# Patient Record
Sex: Female | Born: 1960 | Race: White | Hispanic: No | Marital: Married | Smoking: Never smoker
Health system: Southern US, Community
[De-identification: ages and names within clinical notes are randomized; demographics above are authoritative.]

## PROBLEM LIST (undated history)

## (undated) DIAGNOSIS — E669 Obesity, unspecified: Secondary | ICD-10-CM

## (undated) DIAGNOSIS — R7303 Prediabetes: Secondary | ICD-10-CM

## (undated) DIAGNOSIS — F909 Attention-deficit hyperactivity disorder, unspecified type: Secondary | ICD-10-CM

## (undated) DIAGNOSIS — J45909 Unspecified asthma, uncomplicated: Secondary | ICD-10-CM

## (undated) DIAGNOSIS — F329 Major depressive disorder, single episode, unspecified: Secondary | ICD-10-CM

## (undated) DIAGNOSIS — F32A Depression, unspecified: Secondary | ICD-10-CM

## (undated) DIAGNOSIS — K828 Other specified diseases of gallbladder: Secondary | ICD-10-CM

## (undated) DIAGNOSIS — K219 Gastro-esophageal reflux disease without esophagitis: Secondary | ICD-10-CM

## (undated) DIAGNOSIS — I10 Essential (primary) hypertension: Secondary | ICD-10-CM

## (undated) DIAGNOSIS — E785 Hyperlipidemia, unspecified: Secondary | ICD-10-CM

## (undated) DIAGNOSIS — R7301 Impaired fasting glucose: Secondary | ICD-10-CM

## (undated) HISTORY — DX: Unspecified asthma, uncomplicated: J45.909

## (undated) HISTORY — DX: Essential (primary) hypertension: I10

## (undated) HISTORY — DX: Other specified diseases of gallbladder: K82.8

## (undated) HISTORY — PX: TONSILLECTOMY: SUR1361

## (undated) HISTORY — DX: Hyperlipidemia, unspecified: E78.5

## (undated) HISTORY — DX: Depression, unspecified: F32.A

## (undated) HISTORY — DX: Prediabetes: R73.03

## (undated) HISTORY — DX: Gastro-esophageal reflux disease without esophagitis: K21.9

## (undated) HISTORY — DX: Attention-deficit hyperactivity disorder, unspecified type: F90.9

## (undated) HISTORY — DX: Impaired fasting glucose: R73.01

## (undated) HISTORY — DX: Obesity, unspecified: E66.9

---
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
## 2004-06-05 HISTORY — PX: ABDOMINAL HYSTERECTOMY: SHX81

## 2004-06-05 HISTORY — PX: VAGINAL HYSTERECTOMY: SUR661

## 2007-06-06 HISTORY — PX: HERNIA REPAIR: SHX51

## 2012-05-05 LAB — HM PAP SMEAR

## 2015-02-19 LAB — HM COLONOSCOPY

## 2017-05-25 LAB — HM DIABETES EYE EXAM

## 2017-08-08 LAB — HM MAMMOGRAPHY: HM Mammogram: ABNORMAL — AB (ref 0–4)

## 2018-06-05 HISTORY — PX: CHOLECYSTECTOMY: SHX55

## 2019-07-14 ENCOUNTER — Other Ambulatory Visit: Payer: Self-pay

## 2019-07-14 ENCOUNTER — Other Ambulatory Visit: Payer: Self-pay | Admitting: Family Medicine

## 2019-07-14 DIAGNOSIS — K219 Gastro-esophageal reflux disease without esophagitis: Secondary | ICD-10-CM | POA: Insufficient documentation

## 2019-07-14 DIAGNOSIS — E669 Obesity, unspecified: Secondary | ICD-10-CM | POA: Insufficient documentation

## 2019-07-14 DIAGNOSIS — F330 Major depressive disorder, recurrent, mild: Secondary | ICD-10-CM | POA: Insufficient documentation

## 2019-07-14 DIAGNOSIS — F909 Attention-deficit hyperactivity disorder, unspecified type: Secondary | ICD-10-CM | POA: Insufficient documentation

## 2019-07-14 DIAGNOSIS — E785 Hyperlipidemia, unspecified: Secondary | ICD-10-CM | POA: Insufficient documentation

## 2019-07-14 DIAGNOSIS — J45901 Unspecified asthma with (acute) exacerbation: Secondary | ICD-10-CM | POA: Insufficient documentation

## 2019-07-14 DIAGNOSIS — K828 Other specified diseases of gallbladder: Secondary | ICD-10-CM | POA: Insufficient documentation

## 2019-07-14 DIAGNOSIS — F329 Major depressive disorder, single episode, unspecified: Secondary | ICD-10-CM | POA: Insufficient documentation

## 2019-07-14 DIAGNOSIS — J45909 Unspecified asthma, uncomplicated: Secondary | ICD-10-CM | POA: Insufficient documentation

## 2019-07-14 MED ORDER — AMPHETAMINE-DEXTROAMPHETAMINE 20 MG PO TABS: 20.0000 mg | ORAL_TABLET | Freq: Two times a day (BID) | ORAL | 0 refills | Status: DC

## 2019-07-18 ENCOUNTER — Encounter: Payer: Self-pay | Admitting: Family Medicine

## 2019-07-18 ENCOUNTER — Ambulatory Visit: Payer: BC Managed Care – PPO | Admitting: Family Medicine

## 2019-07-18 ENCOUNTER — Other Ambulatory Visit: Payer: Self-pay

## 2019-07-18 VITALS — BP 122/68 | HR 78 | Temp 97.8°F | Ht 68.0 in | Wt 178.4 lb

## 2019-07-18 DIAGNOSIS — H66002 Acute suppurative otitis media without spontaneous rupture of ear drum, left ear: Secondary | ICD-10-CM | POA: Insufficient documentation

## 2019-07-18 DIAGNOSIS — H60312 Diffuse otitis externa, left ear: Secondary | ICD-10-CM | POA: Insufficient documentation

## 2019-07-18 MED ORDER — SERTRALINE HCL 50 MG PO TABS: 50.0000 mg | ORAL_TABLET | Freq: Every day | ORAL | 1 refills | Status: DC

## 2019-07-18 MED ORDER — AMOXICILLIN-POT CLAVULANATE 875-125 MG PO TABS: 1.0000 | ORAL_TABLET | Freq: Two times a day (BID) | ORAL | 0 refills | Status: DC

## 2019-07-18 MED ORDER — IBUPROFEN 800 MG PO TABS: 800.0000 mg | ORAL_TABLET | Freq: Three times a day (TID) | ORAL | 1 refills | Status: DC

## 2019-07-18 MED ORDER — CIPROFLOXACIN-DEXAMETHASONE 0.3-0.1 % OT SUSP: 4.0000 [drp] | Freq: Two times a day (BID) | OTIC | 0 refills | Status: AC

## 2019-07-18 MED ORDER — OMEPRAZOLE 40 MG PO CPDR: 40.0000 mg | DELAYED_RELEASE_CAPSULE | Freq: Two times a day (BID) | ORAL | 3 refills | Status: DC

## 2019-07-18 NOTE — Patient Instructions (Signed)

## 2019-07-18 NOTE — Progress Notes (Signed)
Acute Office Visit  Subjective:    Patient ID: Brittany Walton, female    DOB: 1961-04-09, 59 y.o.   MRN: [id number]  Chief Complaint  Patient presents with  . Ear Pain    x 3 days throbbing, drainage, taking motrin and tylenol    HPI Patient is in today for earache left for 3 days. Has had allergies since December. Has been taking sudafed. Taking motrin 800 mg and tylenol.  Radiating into her face and jaw. Pain is throbbing and drainage.   Past Medical History:  Diagnosis Date  . ADHD   . Asthma   . Biliary dyskinesia   . Depression   . GERD (gastroesophageal reflux disease)   . Hyperlipidemia   . Hypertension   . Impaired fasting glucose   . Obesity     Past Surgical History:  Procedure Laterality Date  . ABDOMINAL HYSTERECTOMY  2006   patrial for uterine prolapse  . HERNIA REPAIR  5643   umbilical, bilateral inguinal    Family History  Problem Relation Age of Onset  . Heart attack Mother   . Dementia Mother     Social History   Socioeconomic History  . Marital status: Married    Spouse name: Not on file  . Number of children: 2  . Years of education: Not on file  . Highest education level: Not on file  Occupational History  . 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 Smoking status: Never Smoker  . Smokeless tobacco: Never Used  Substance and Sexual Activity  . Alcohol use: Yes    Comment: Drinks alcohol very infrequently. She typically consumes wine.  . Drug use: Never  . Sexual activity: Not on file  Other Topics Concern  . Not on file  Social History Narrative  . Not on file   Social Determinants of Health   Financial Resource Strain:   . Difficulty of Paying Living Expenses: Not on file  Food Insecurity:   . Worried About Charity fundraiser in the Last Year: Not on file  . Ran Out of Food in the Last Year: Not on file  Transportation Needs:   . Lack of Transportation (Medical): Not on file  . Lack of Transportation  (Non-Medical): Not on file  Physical Activity:   . Days of Exercise per Week: Not on file  . Minutes of Exercise per Session: Not on file  Stress:   . Feeling of Stress : Not on file  Social Connections:   . Frequency of Communication with Friends and Family: Not on file  . Frequency of Social Gatherings with Friends and Family: Not on file  . Attends Religious Services: Not on file  . Active Member of Clubs or Organizations: Not on file  . Attends Archivist Meetings: Not on file  . Marital Status: Not on file  Intimate Partner Violence:   . Fear of Current or Ex-Partner: Not on file  . Emotionally Abused: Not on file  . Physically Abused: Not on file  . Sexually Abused: Not on file    Outpatient Medications Prior to Visit  Medication Sig Dispense Refill  . albuterol (PROAIR HFA) 108 (90 Base) MCG/ACT inhaler Inhale 1-2 puffs into the lungs every 6 (six) hours as needed for wheezing or shortness of breath.    Marland Kitchen albuterol (PROVENTIL) (2.5 MG/3ML) 0.083% nebulizer solution Take 2.5 mg by nebulization every 6 (six) hours as needed for wheezing or shortness of breath.    Marland Kitchen  amphetamine-dextroamphetamine (ADDERALL) 20 MG tablet Take 1 tablet (20 mg total) by mouth 2 (two) times daily. 60 tablet 0  . budesonide-formoterol (SYMBICORT) 160-4.5 MCG/ACT inhaler Inhale 2 puffs into the lungs 2 (two) times daily.    Marland Kitchen ezetimibe (ZETIA) 10 MG tablet Take 10 mg by mouth daily.    Marland Kitchen olmesartan (BENICAR) 20 MG tablet Take 20 mg by mouth daily.    Marland Kitchen ibuprofen (IBU) 800 MG tablet Take 800 mg by mouth 3 (three) times daily. PRN with food    . omeprazole (PRILOSEC) 40 MG capsule Take 40 mg by mouth 2 (two) times daily.    . sertraline (ZOLOFT) 50 MG tablet Take 50 mg by mouth daily.     No facility-administered medications prior to visit.    Allergies  Allergen Reactions  . Atorvastatin Other (See Comments)    Muscle cramps  . Codeine   . Crestor [Rosuvastatin] Other (See Comments)     Myalgias  . Diphenhydramine Hcl   . Sulfur     Review of Systems  Constitutional: Negative for chills, fatigue and fever.  HENT: Positive for congestion, ear discharge, ear pain, rhinorrhea, sinus pressure and sinus pain. Negative for sore throat.   Respiratory: Negative for cough and shortness of breath.   Cardiovascular: Negative for chest pain.       Objective:    Physical Exam Constitutional:      Appearance: Normal appearance.  HENT:     Right Ear: Tympanic membrane and ear canal normal.     Left Ear: Drainage, swelling and tenderness present.     Nose: Mucosal edema, congestion and rhinorrhea present.     Mouth/Throat:     Pharynx: Posterior oropharyngeal erythema present. No pharyngeal swelling or oropharyngeal exudate.  Cardiovascular:     Rate and Rhythm: Normal rate and regular rhythm.  Pulmonary:     Effort: Pulmonary effort is normal.     Breath sounds: Normal breath sounds.  Lymphadenopathy:     Cervical: Cervical adenopathy present.     Right cervical: No superficial or posterior cervical adenopathy.    Left cervical: Superficial cervical adenopathy present. No posterior cervical adenopathy.  Neurological:     Mental Status: She is alert.     BP 122/68 (BP Location: Left Arm, Patient Position: Sitting)   Pulse 78   Temp 97.8 F (36.6 C) (Temporal)   Ht 5' 8"  (1.727 m)   Wt 178 lb 6.4 oz (80.9 kg)   SpO2 97%   BMI 27.13 kg/m  Wt Readings from Last 3 Encounters:  07/18/19 178 lb 6.4 oz (80.9 kg)    Health Maintenance Due  Topic Date Due  . Hepatitis C Screening  Oct 04, 1960  . HIV Screening  02/16/1975  . TETANUS/TDAP  02/16/1979  . PAP SMEAR-Modifier  05/06/2015  . INFLUENZA VACCINE  01/04/2019    There are no preventive care reminders to display for this patient.   No results found for: TSH No results found for: WBC, HGB, HCT, MCV, PLT No results found for: NA, K, CHLORIDE, CO2, GLUCOSE, BUN, CREATININE, BILITOT, ALKPHOS, AST, ALT, PROT,  ALBUMIN, CALCIUM, ANIONGAP, EGFR, GFR No results found for: CHOL No results found for: HDL No results found for: LDLCALC No results found for: TRIG No results found for: CHOLHDL No results found for: HGBA1C     Assessment & Plan:   Problem List Items Addressed This Visit      Nervous and Auditory   Acute diffuse otitis externa of left ear -  Primary    Given ciprodex. Use ibuprofen or tylenol for pain.       Non-recurrent acute suppurative otitis media of left ear without spontaneous rupture of tympanic membrane    Given augmentin for possible otitis media. Unable to view, but patient has lymphadenopathy and concerning for infection.      Relevant Medications   amoxicillin-clavulanate (AUGMENTIN) 875-125 MG tablet       Meds ordered this encounter  Medications  . sertraline (ZOLOFT) 50 MG tablet    Sig: Take 1 tablet (50 mg total) by mouth daily.    Dispense:  90 tablet    Refill:  1  . omeprazole (PRILOSEC) 40 MG capsule    Sig: Take 1 capsule (40 mg total) by mouth 2 (two) times daily.    Dispense:  180 capsule    Refill:  3  . ibuprofen (IBU) 800 MG tablet    Sig: Take 1 tablet (800 mg total) by mouth 3 (three) times daily. PRN with food    Dispense:  90 tablet    Refill:  1  . ciprofloxacin-dexamethasone (CIPRODEX) OTIC suspension    Sig: Place 4 drops into the left ear 2 (two) times daily for 7 days.    Dispense:  2.8 mL    Refill:  0  . amoxicillin-clavulanate (AUGMENTIN) 875-125 MG tablet    Sig: Take 1 tablet by mouth 2 (two) times daily.    Dispense:  20 tablet    Refill:  0     Rochel Brome, MD

## 2019-07-18 NOTE — Assessment & Plan Note (Signed)
Given augmentin for possible otitis media. Unable to view, but patient has lymphadenopathy and concerning for infection.

## 2019-07-18 NOTE — Assessment & Plan Note (Signed)
Given ciprodex. Use ibuprofen or tylenol for pain.

## 2019-07-21 ENCOUNTER — Ambulatory Visit: Payer: BC Managed Care – PPO | Admitting: Family Medicine

## 2019-09-10 ENCOUNTER — Other Ambulatory Visit: Payer: Self-pay

## 2019-09-10 MED ORDER — AMPHETAMINE-DEXTROAMPHETAMINE 20 MG PO TABS: 20.0000 mg | ORAL_TABLET | Freq: Two times a day (BID) | ORAL | 0 refills | Status: DC

## 2019-09-17 ENCOUNTER — Telehealth (INDEPENDENT_AMBULATORY_CARE_PROVIDER_SITE_OTHER): Payer: BC Managed Care – PPO | Admitting: Family Medicine

## 2019-09-17 DIAGNOSIS — J208 Acute bronchitis due to other specified organisms: Secondary | ICD-10-CM

## 2019-09-17 DIAGNOSIS — J4531 Mild persistent asthma with (acute) exacerbation: Secondary | ICD-10-CM

## 2019-09-17 MED ORDER — AZITHROMYCIN 250 MG PO TABS: ORAL_TABLET | ORAL | 0 refills | Status: DC

## 2019-09-17 MED ORDER — PREDNISONE 50 MG PO TABS: 50.0000 mg | ORAL_TABLET | Freq: Every day | ORAL | 0 refills | Status: DC

## 2019-09-17 NOTE — Progress Notes (Signed)
TELEHEALTH VISIT  This visit type was conducted due to national recommendations for restrictions regarding the COVID-19 Pandemic (e.g. social distancing) in an effort to limit this patient's exposure and mitigate transmission in our community.  Due to her co-morbid illnesses, this patient is at least at moderate risk for complications without adequate follow up.  This format is felt to be most appropriate for this patient at this time.  The patient did not have access to video technology/had technical difficulties with video requiring transitioning to audio format only (telephone).  All issues noted in this document were discussed and addressed.  No physical exam could be performed with this format.  Patient verbally consented to a telehealth visit.   Patient location: Work Physician location: Work  Type of visit: Acute  Subjective:  Patient ID: Brittany Walton, female    DOB: 11-29-1960  Age: 59 y.o. MRN: [id number]  CC:  Chief Complaint  Patient presents with  . URI  . Cough    HPI Brittany Walton is complaining of lung congestion, wheezing, dry coughing for 2 weeks.  Patient has a history of asthma and has been using her ProAir a couple of times a day.  She has Symbicort however has not been using it.  She has been taking Sudafed which has helped.  Patient denies fever, chills, sweats.  Denies any known exposure to Covid .  She has been masking and distancing.  Past Medical History:  Diagnosis Date  . ADHD   . Asthma   . Biliary dyskinesia   . Depression   . GERD (gastroesophageal reflux disease)   . Hyperlipidemia   . Hypertension   . Impaired fasting glucose   . Obesity     Past Surgical History:  Procedure Laterality Date  . ABDOMINAL HYSTERECTOMY  2006   patrial for uterine prolapse  . HERNIA REPAIR  9983   umbilical, bilateral inguinal    Family History  Problem Relation Age of Onset  . Heart attack Mother   . Dementia Mother     Social History   Socioeconomic  History  . Marital status: Married    Spouse name: Not on file  . Number of children: 2  . Years of education: Not on file  . Highest education level: Not on file  Occupational History  . 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 Smoking status: Never Smoker  . Smokeless tobacco: Never Used  Substance and Sexual Activity  . Alcohol use: Yes    Comment: Drinks alcohol very infrequently. She typically consumes wine.  . Drug use: Never  . Sexual activity: Not on file  Other Topics Concern  . Not on file  Social History Narrative  . Not on file   Social Determinants of Health   Financial Resource Strain:   . Difficulty of Paying Living Expenses:   Food Insecurity:   . Worried About Charity fundraiser in the Last Year:   . Arboriculturist in the Last Year:   Transportation Needs:   . Film/video editor (Medical):   Marland Kitchen Lack of Transportation (Non-Medical):   Physical Activity:   . Days of Exercise per Week:   . Minutes of Exercise per Session:   Stress:   . Feeling of Stress :   Social Connections:   . Frequency of Communication with Friends and Family:   . Frequency of Social Gatherings with Friends and Family:   . Attends Religious Services:   .  Active Member of Clubs or Organizations:   . Attends Archivist Meetings:   Marland Kitchen Marital Status:   Intimate Partner Violence:   . Fear of Current or Ex-Partner:   . Emotionally Abused:   Marland Kitchen Physically Abused:   . Sexually Abused:     Outpatient Medications Prior to Visit  Medication Sig Dispense Refill  . albuterol (PROAIR HFA) 108 (90 Base) MCG/ACT inhaler Inhale 1-2 puffs into the lungs every 6 (six) hours as needed for wheezing or shortness of breath.    Marland Kitchen albuterol (PROVENTIL) (2.5 MG/3ML) 0.083% nebulizer solution Take 2.5 mg by nebulization every 6 (six) hours as needed for wheezing or shortness of breath.    Marland Kitchen amoxicillin-clavulanate (AUGMENTIN) 875-125 MG tablet Take 1 tablet by mouth 2  (two) times daily. 20 tablet 0  . amphetamine-dextroamphetamine (ADDERALL) 20 MG tablet Take 1 tablet (20 mg total) by mouth 2 (two) times daily. 60 tablet 0  . budesonide-formoterol (SYMBICORT) 160-4.5 MCG/ACT inhaler Inhale 2 puffs into the lungs 2 (two) times daily.    Marland Kitchen ezetimibe (ZETIA) 10 MG tablet Take 10 mg by mouth daily.    Marland Kitchen ibuprofen (IBU) 800 MG tablet Take 1 tablet (800 mg total) by mouth 3 (three) times daily. PRN with food 90 tablet 1  . olmesartan (BENICAR) 20 MG tablet Take 20 mg by mouth daily.    Marland Kitchen omeprazole (PRILOSEC) 40 MG capsule Take 1 capsule (40 mg total) by mouth 2 (two) times daily. 180 capsule 3  . sertraline (ZOLOFT) 50 MG tablet Take 1 tablet (50 mg total) by mouth daily. 90 tablet 1   No facility-administered medications prior to visit.    Allergies  Allergen Reactions  . Atorvastatin Other (See Comments)    Muscle cramps  . Codeine   . Crestor [Rosuvastatin] Other (See Comments)    Myalgias  . Diphenhydramine Hcl   . Sulfur     ROS Review of Systems  Constitutional: Negative for chills, fatigue and fever.  HENT: Positive for congestion. Negative for ear pain, rhinorrhea, sinus pressure, sinus pain and sore throat.        Denies.  Denies loss of taste or smell  Respiratory: Negative for cough and shortness of breath.   Cardiovascular: Negative for chest pain.  Gastrointestinal: Negative for abdominal pain, constipation, diarrhea, nausea and vomiting.  Musculoskeletal: Negative for myalgias.  Neurological: Negative for headaches.      Objective:    Physical Exam  Constitutional: No distress.  sounds congested  Pulmonary/Chest: No respiratory distress.  Neurological: She is alert.    There were no vitals taken for this visit. Wt Readings from Last 3 Encounters:  07/18/19 178 lb 6.4 oz (80.9 kg)  Patient is unable to get vital signs.  She has been checking her temperature over the last few days and denies having a fever.   Health  Maintenance Due  Topic Date Due  . Hepatitis C Screening  Never done  . HIV Screening  Never done  . COVID-19 Vaccine (1) Never done  . TETANUS/TDAP  Never done  . PAP SMEAR-Modifier  05/06/2015  . MAMMOGRAM  08/09/2019    There are no preventive care reminders to display for this patient.  No results found for: TSH No results found for: WBC, HGB, HCT, MCV, PLT No results found for: NA, K, CHLORIDE, CO2, GLUCOSE, BUN, CREATININE, BILITOT, ALKPHOS, AST, ALT, PROT, ALBUMIN, CALCIUM, ANIONGAP, EGFR, GFR No results found for: CHOL No results found for: HDL No results found  for: [place] No results found for: TRIG No results found for: CHOLHDL No results found for: HGBA1C    Assessment & Plan:  1. Acute bronchitis due to other specified organisms Zpack prescribed.  2. Asthma, extrinsic with exacerbation, mild persistent Prednisone prescribed.  Recommend start symbicort 2 puffs twice a day and use proair 2 puffs up to four times a day for wheezing and dyspnea.  Problem List Items Addressed This Visit    None      Meds ordered this encounter  Medications  . predniSONE (DELTASONE) 50 MG tablet    Sig: Take 1 tablet (50 mg total) by mouth daily with breakfast.    Dispense:  5 tablet    Refill:  0  . azithromycin (ZITHROMAX) 250 MG tablet    Sig: 2 on day 1, 1 pill day 2-5.    Dispense:  6 each    Refill:  0   Time spent on phone: 12 minutes. Follow-up: No follow-ups on file.    Rochel Brome, MD

## 2019-09-21 ENCOUNTER — Encounter: Payer: Self-pay | Admitting: Family Medicine

## 2019-09-21 DIAGNOSIS — J208 Acute bronchitis due to other specified organisms: Secondary | ICD-10-CM | POA: Insufficient documentation

## 2019-09-21 DIAGNOSIS — J4531 Mild persistent asthma with (acute) exacerbation: Secondary | ICD-10-CM | POA: Insufficient documentation

## 2019-09-30 ENCOUNTER — Other Ambulatory Visit: Payer: Self-pay | Admitting: Family Medicine

## 2019-10-08 ENCOUNTER — Ambulatory Visit: Payer: BC Managed Care – PPO | Admitting: Family Medicine

## 2019-10-08 ENCOUNTER — Encounter: Payer: Self-pay | Admitting: Family Medicine

## 2019-10-08 ENCOUNTER — Other Ambulatory Visit: Payer: Self-pay

## 2019-10-08 VITALS — BP 116/78 | HR 103 | Temp 96.3°F | Ht 67.0 in | Wt 184.0 lb

## 2019-10-08 DIAGNOSIS — R05 Cough: Secondary | ICD-10-CM

## 2019-10-08 DIAGNOSIS — J4531 Mild persistent asthma with (acute) exacerbation: Secondary | ICD-10-CM | POA: Diagnosis not present

## 2019-10-08 DIAGNOSIS — R0609 Other forms of dyspnea: Secondary | ICD-10-CM | POA: Insufficient documentation

## 2019-10-08 DIAGNOSIS — R059 Cough, unspecified: Secondary | ICD-10-CM | POA: Insufficient documentation

## 2019-10-08 DIAGNOSIS — R0600 Dyspnea, unspecified: Secondary | ICD-10-CM

## 2019-10-08 DIAGNOSIS — J329 Chronic sinusitis, unspecified: Secondary | ICD-10-CM | POA: Insufficient documentation

## 2019-10-08 DIAGNOSIS — J0181 Other acute recurrent sinusitis: Secondary | ICD-10-CM

## 2019-10-08 DIAGNOSIS — R051 Acute cough: Secondary | ICD-10-CM | POA: Insufficient documentation

## 2019-10-08 LAB — POC COVID19 BINAXNOW: SARS Coronavirus 2 Ag: NEGATIVE

## 2019-10-08 MED ORDER — PREDNISONE 50 MG PO TABS: ORAL_TABLET | ORAL | 0 refills | Status: DC

## 2019-10-08 MED ORDER — AZITHROMYCIN 250 MG PO TABS: ORAL_TABLET | ORAL | 0 refills | Status: DC

## 2019-10-08 NOTE — Progress Notes (Signed)
Acute Office Visit  Subjective:    Patient ID: Brittany Walton, female    DOB: 08/01/1960, 59 y.o.   MRN: [id number]  Chief Complaint  Patient presents with  . URI    Cough started Sunday    HPI Patient is in today for cough, sob, wheezing which began on Sunday. Patient reports having chills, nasal congestion, fullness of her ears, runny nose, chest pain when coughing, very mild muscle pain and lightheadedness.  She denies exposure to any known Covid patient.  She is a Education officer, museum.  She does wear her mask.  She has had her first Covid vaccine.  Past Medical History:  Diagnosis Date  . ADHD   . Asthma   . Biliary dyskinesia   . Depression   . GERD (gastroesophageal reflux disease)   . Hyperlipidemia   . Hypertension   . Impaired fasting glucose   . Obesity     Past Surgical History:  Procedure Laterality Date  . ABDOMINAL HYSTERECTOMY  2006   patrial for uterine prolapse  . HERNIA REPAIR  5859   umbilical, bilateral inguinal    Family History  Problem Relation Age of Onset  . Heart attack Mother   . Dementia Mother     Social History   Socioeconomic History  . Marital status: Married    Spouse name: Not on file  . Number of children: 2  . Years of education: Not on file  . Highest education level: Not on file  Occupational History  . 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 Smoking status: Never Smoker  . Smokeless tobacco: Never Used  Substance and Sexual Activity  . Alcohol use: Yes    Comment: Drinks alcohol very infrequently. She typically consumes wine.  . Drug use: Never  . Sexual activity: Not on file  Other Topics Concern  . Not on file  Social History Narrative  . Not on file   Social Determinants of Health   Financial Resource Strain:   . Difficulty of Paying Living Expenses:   Food Insecurity:   . Worried About Charity fundraiser in the Last Year:   . Arboriculturist in the Last Year:   Transportation Needs:     . Film/video editor (Medical):   Marland Kitchen Lack of Transportation (Non-Medical):   Physical Activity:   . Days of Exercise per Week:   . Minutes of Exercise per Session:   Stress:   . Feeling of Stress :   Social Connections:   . Frequency of Communication with Friends and Family:   . Frequency of Social Gatherings with Friends and Family:   . Attends Religious Services:   . Active Member of Clubs or Organizations:   . Attends Archivist Meetings:   Marland Kitchen Marital Status:   Intimate Partner Violence:   . Fear of Current or Ex-Partner:   . Emotionally Abused:   Marland Kitchen Physically Abused:   . Sexually Abused:     Outpatient Medications Prior to Visit  Medication Sig Dispense Refill  . albuterol (PROAIR HFA) 108 (90 Base) MCG/ACT inhaler Inhale 1-2 puffs into the lungs every 6 (six) hours as needed for wheezing or shortness of breath.    Marland Kitchen albuterol (PROVENTIL) (2.5 MG/3ML) 0.083% nebulizer solution Take 2.5 mg by nebulization every 6 (six) hours as needed for wheezing or shortness of breath.    Marland Kitchen amoxicillin-clavulanate (AUGMENTIN) 875-125 MG tablet Take 1 tablet by mouth 2 (two) times daily.  20 tablet 0  . amphetamine-dextroamphetamine (ADDERALL) 20 MG tablet Take 1 tablet (20 mg total) by mouth 2 (two) times daily. 60 tablet 0  . azithromycin (ZITHROMAX) 250 MG tablet 2 on day 1, 1 pill day 2-5. 6 each 0  . budesonide-formoterol (SYMBICORT) 160-4.5 MCG/ACT inhaler Inhale 2 puffs into the lungs 2 (two) times daily.    Marland Kitchen ezetimibe (ZETIA) 10 MG tablet TAKE ONE (1) TABLET ONCE DAILY 90 tablet 1  . ibuprofen (IBU) 800 MG tablet Take 1 tablet (800 mg total) by mouth 3 (three) times daily. PRN with food 90 tablet 1  . olmesartan (BENICAR) 20 MG tablet TAKE ONE (1) TABLET ONCE DAILY 90 tablet 1  . omeprazole (PRILOSEC) 40 MG capsule Take 1 capsule (40 mg total) by mouth 2 (two) times daily. 180 capsule 3  . predniSONE (DELTASONE) 50 MG tablet Take 1 tablet (50 mg total) by mouth daily with  breakfast. 5 tablet 0  . sertraline (ZOLOFT) 50 MG tablet Take 1 tablet (50 mg total) by mouth daily. 90 tablet 1   No facility-administered medications prior to visit.    Allergies  Allergen Reactions  . Atorvastatin Other (See Comments)    Muscle cramps  . Codeine   . Crestor [Rosuvastatin] Other (See Comments)    Myalgias  . Diphenhydramine Hcl   . Sulfur      Review of Systems  Constitutional: Positive for chills and fatigue. Negative for fever.  HENT: Positive for congestion, ear pain (full) and rhinorrhea. Negative for sore throat.   Respiratory: Positive for cough, shortness of breath and wheezing.   Cardiovascular: Positive for chest pain (With coughing).  Gastrointestinal: Negative for abdominal pain, constipation, diarrhea, nausea and vomiting.  Musculoskeletal: Positive for myalgias (little achy.). Negative for arthralgias and back pain.       Feels achey  Neurological: Positive for light-headedness. Negative for dizziness, weakness and headaches.  Psychiatric/Behavioral: Negative for dysphoric mood. The patient is not nervous/anxious.        Objective:    Physical Exam Vitals reviewed.  Constitutional:      Appearance: Normal appearance. She is normal weight.  HENT:     Right Ear: Tympanic membrane, ear canal and external ear normal.     Left Ear: Tympanic membrane, ear canal and external ear normal.     Nose: Congestion and rhinorrhea present.     Mouth/Throat:     Pharynx: Oropharynx is clear. No oropharyngeal exudate or posterior oropharyngeal erythema.  Cardiovascular:     Rate and Rhythm: Normal rate and regular rhythm.     Pulses: Normal pulses.     Heart sounds: Normal heart sounds. No murmur.  Pulmonary:     Effort: Pulmonary effort is normal. No respiratory distress.     Breath sounds: Normal breath sounds.  Neurological:     Mental Status: She is alert and oriented to person, place, and time.  Psychiatric:        Mood and Affect: Mood normal.         Behavior: Behavior normal.     BP 116/78   Pulse (!) 103   Temp (!) 96.3 F (35.7 C)   Ht 5' 7"  (1.702 m)   Wt 184 lb (83.5 kg)   SpO2 96%   BMI 28.82 kg/m  Wt Readings from Last 3 Encounters:  10/08/19 184 lb (83.5 kg)  07/18/19 178 lb 6.4 oz (80.9 kg)    Health Maintenance Due  Topic Date Due  . Hepatitis C  Screening  Never done  . HIV Screening  Never done  . COVID-19 Vaccine (1) Never done  . TETANUS/TDAP  Never done  . PAP SMEAR-Modifier  05/06/2015  . MAMMOGRAM  08/09/2019    There are no preventive care reminders to display for this patient.   No results found for: TSH No results found for: WBC, HGB, HCT, MCV, PLT No results found for: NA, K, CHLORIDE, CO2, GLUCOSE, BUN, CREATININE, BILITOT, ALKPHOS, AST, ALT, PROT, ALBUMIN, CALCIUM, ANIONGAP, EGFR, GFR No results found for: CHOL No results found for: HDL No results found for: LDLCALC No results found for: TRIG No results found for: CHOLHDL No results found for: HGBA1C     Assessment & Plan:  1. Sinusitis, Other. - azithromycin (ZITHROMAX) 250 MG tablet; 2 pills on day 1. 1 pill daily for days 2-5.  Dispense: 6 tablet; Refill: 0  2. Asthma, extrinsic with exacerbation, mild persistent - predniSONE (DELTASONE) 50 MG tablet; Take 1 tablet by mouth daily  Dispense: 5 tablet; Refill: 0 - Novel Coronavirus, NAA ([facility])   3. DYSPNEA/Cough   Novel Coronavirus, NAA ([facility]) - POC COVID-19 Negative.  Put out of work until Monday 10/13/2019. Follow-up: No follow-ups on file.  An After Visit Summary was printed and given to the patient.  Rochel Brome [facility] [phone]

## 2019-10-09 LAB — NOVEL CORONAVIRUS, NAA: SARS-CoV-2, NAA: NOT DETECTED

## 2019-10-09 LAB — SARS-COV-2, NAA 2 DAY TAT

## 2019-10-14 ENCOUNTER — Other Ambulatory Visit: Payer: Self-pay

## 2019-10-15 ENCOUNTER — Encounter: Payer: Self-pay | Admitting: Family Medicine

## 2019-10-16 ENCOUNTER — Telehealth: Payer: Self-pay

## 2019-10-16 MED ORDER — AMOXICILLIN-POT CLAVULANATE 875-125 MG PO TABS: 1.0000 | ORAL_TABLET | Freq: Two times a day (BID) | ORAL | 0 refills | Status: DC

## 2019-10-16 NOTE — Telephone Encounter (Signed)
Patient informed that chest xray did not show pneumonia. Ax Bx sent in per Dr. Sedalia Muta.

## 2019-10-24 ENCOUNTER — Other Ambulatory Visit: Payer: Self-pay | Admitting: Family Medicine

## 2019-11-28 ENCOUNTER — Encounter: Payer: Self-pay | Admitting: Physician Assistant

## 2019-11-28 ENCOUNTER — Other Ambulatory Visit: Payer: Self-pay

## 2019-11-28 ENCOUNTER — Ambulatory Visit (INDEPENDENT_AMBULATORY_CARE_PROVIDER_SITE_OTHER): Payer: BC Managed Care – PPO | Admitting: Physician Assistant

## 2019-11-28 VITALS — BP 110/78 | HR 80 | Temp 97.9°F | Ht 67.0 in | Wt 184.0 lb

## 2019-11-28 DIAGNOSIS — R05 Cough: Secondary | ICD-10-CM

## 2019-11-28 DIAGNOSIS — R059 Cough, unspecified: Secondary | ICD-10-CM

## 2019-11-28 MED ORDER — MONTELUKAST SODIUM 10 MG PO TABS
10.0000 mg | ORAL_TABLET | Freq: Every day | ORAL | 3 refills | Status: DC
Start: 2019-11-28 — End: 2020-03-24

## 2019-11-28 NOTE — Assessment & Plan Note (Signed)
Take symbicort as directed Trial singulair 10mg  qd Follow up if symptoms persist or worsen

## 2019-11-28 NOTE — Progress Notes (Signed)
Acute Office Visit  Subjective:    Patient ID: Brittany Walton, female    DOB: Nov 29, 1960, 59 y.o.   MRN: [id number]  Chief Complaint  Patient presents with  . Cough    on-going cough    HPI Patient is in today for recurrent cough Pt has seen Dr Sedalia Muta on several occasions for a cough - she has had 2 rounds of prednisone and antibiotics - had a chest xray recently that was normal She states that the cough improved for awhile but now has come back She does have a history of allergies and asthma - has not been taking her inhalers as directed States she has some mild congestion now   Past Medical History:  Diagnosis Date  . ADHD   . Asthma   . Biliary dyskinesia   . Depression   . GERD (gastroesophageal reflux disease)   . Hyperlipidemia   . Hypertension   . Impaired fasting glucose   . Obesity     Past Surgical History:  Procedure Laterality Date  . ABDOMINAL HYSTERECTOMY  2006   patrial for uterine prolapse  . HERNIA REPAIR  2009   umbilical, bilateral inguinal    Family History  Problem Relation Age of Onset  . Heart attack Mother   . Dementia Mother     Social History   Socioeconomic History  . Marital status: Married    Spouse name: Not on file  . Number of children: 2  . Years of education: Not on file  . Highest education level: Not on file  Occupational History  . Occupation: Runner, broadcasting/film/video- [employer]  Tobacco Use  . Smoking status: Never Smoker  . Smokeless tobacco: Never Used  Vaping Use  . Vaping Use: Never used  Substance and Sexual Activity  . Alcohol use: Yes    Comment: Drinks alcohol very infrequently. She typically consumes wine.  . Drug use: Never  . Sexual activity: Not on file  Other Topics Concern  . Not on file  Social History Narrative  . Not on file   Social Determinants of Health   Financial Resource Strain:   . Difficulty of Paying Living Expenses:   Food Insecurity:   . Worried About Programme researcher, broadcasting/film/video in the  Last Year:   . Barista in the Last Year:   Transportation Needs:   . Freight forwarder (Medical):   Marland Kitchen Lack of Transportation (Non-Medical):   Physical Activity:   . Days of Exercise per Week:   . Minutes of Exercise per Session:   Stress:   . Feeling of Stress :   Social Connections:   . Frequency of Communication with Friends and Family:   . Frequency of Social Gatherings with Friends and Family:   . Attends Religious Services:   . Active Member of Clubs or Organizations:   . Attends Banker Meetings:   Marland Kitchen Marital Status:   Intimate Partner Violence:   . Fear of Current or Ex-Partner:   . Emotionally Abused:   Marland Kitchen Physically Abused:   . Sexually Abused:      Current Outpatient Medications:  .  albuterol (PROAIR HFA) 108 (90 Base) MCG/ACT inhaler, Inhale 1-2 puffs into the lungs every 6 (six) hours as needed for wheezing or shortness of breath., Disp: , Rfl:  .  albuterol (PROVENTIL) (2.5 MG/3ML) 0.083% nebulizer solution, Take 2.5 mg by nebulization every 6 (six) hours as needed for wheezing or shortness of breath., Disp: ,  Rfl:  .  amphetamine-dextroamphetamine (ADDERALL) 20 MG tablet, TAKE ONE TABLET TWICE DAILY, Disp: 60 tablet, Rfl: 0 .  budesonide-formoterol (SYMBICORT) 160-4.5 MCG/ACT inhaler, Inhale 2 puffs into the lungs 2 (two) times daily., Disp: , Rfl:  .  ezetimibe (ZETIA) 10 MG tablet, TAKE ONE (1) TABLET ONCE DAILY, Disp: 90 tablet, Rfl: 1 .  ibuprofen (IBU) 800 MG tablet, Take 1 tablet (800 mg total) by mouth 3 (three) times daily. PRN with food, Disp: 90 tablet, Rfl: 1 .  olmesartan (BENICAR) 20 MG tablet, TAKE ONE (1) TABLET ONCE DAILY, Disp: 90 tablet, Rfl: 1 .  omeprazole (PRILOSEC) 40 MG capsule, Take 1 capsule (40 mg total) by mouth 2 (two) times daily., Disp: 180 capsule, Rfl: 3 .  sertraline (ZOLOFT) 50 MG tablet, Take 1 tablet (50 mg total) by mouth daily., Disp: 90 tablet, Rfl: 1 .  montelukast (SINGULAIR) 10 MG tablet, Take 1 tablet  (10 mg total) by mouth at bedtime., Disp: 30 tablet, Rfl: 3   Allergies  Allergen Reactions  . Atorvastatin Other (See Comments)    Muscle cramps  . Codeine   . Crestor [Rosuvastatin] Other (See Comments)    Myalgias  . Diphenhydramine Hcl   . Sulfur     CONSTITUTIONAL: Negative for chills, fatigue, fever, unintentional weight gain and unintentional weight loss.  E/N/T: see HPI CARDIOVASCULAR: Negative for chest pain, dizziness, palpitations and pedal edema.  RESPIRATORY see HPI  PSYCHIATRIC: Negative for sleep disturbance and to question depression screen.  Negative for depression, negative for anhedonia.         Objective:    PHYSICAL EXAM:   VS: BP 110/78 (BP Location: Left Arm, Patient Position: Sitting)   Pulse 80   Temp 97.9 F (36.6 C) (Temporal)   Ht 5\' 7"  (1.702 m)   Wt 184 lb (83.5 kg)   SpO2 98%   BMI 28.82 kg/m   GEN: Well nourished, well developed, in no acute distress  HEENT: normal external ears and nose - normal external auditory canals and TMS -  Oropharynx - normal mucosa, palate, and posterior pharynx Cardiac: RRR; no murmurs, rubs, or gallops,no edema - no significant varicosities Respiratory:  normal respiratory rate and pattern with no distress - normal breath sounds with no rales, rhonchi, wheezes or rubs Psych: euthymic mood, appropriate affect and demeanor   Wt Readings from Last 3 Encounters:  11/28/19 184 lb (83.5 kg)  10/08/19 184 lb (83.5 kg)  07/18/19 178 lb 6.4 oz (80.9 kg)    Health Maintenance Due  Topic Date Due  . Hepatitis C Screening  Never done  . COVID-19 Vaccine (1) Never done  . HIV Screening  Never done  . TETANUS/TDAP  Never done  . PAP SMEAR-Modifier  05/06/2015  . MAMMOGRAM  08/09/2019    There are no preventive care reminders to display for this patient.        Assessment & Plan:   Problem List Items Addressed This Visit      Other   Cough in adult - Primary    Take symbicort as directed Trial  singulair 10mg  qd Follow up if symptoms persist or worsen          Meds ordered this encounter  Medications  . montelukast (SINGULAIR) 10 MG tablet    Sig: Take 1 tablet (10 mg total) by mouth at bedtime.    Dispense:  30 tablet    Refill:  3    Order Specific Question:   Supervising Provider  Answer:   COX, KIRSTEN [id number]     SARA R Nima Kemppainen, PA-C

## 2019-12-05 ENCOUNTER — Other Ambulatory Visit: Payer: Self-pay | Admitting: Family Medicine

## 2019-12-09 ENCOUNTER — Other Ambulatory Visit: Payer: Self-pay | Admitting: Family Medicine

## 2019-12-10 ENCOUNTER — Other Ambulatory Visit: Payer: Self-pay | Admitting: Physician Assistant

## 2019-12-10 MED ORDER — AMPHETAMINE-DEXTROAMPHETAMINE 20 MG PO TABS: 20.0000 mg | ORAL_TABLET | Freq: Two times a day (BID) | ORAL | 0 refills | Status: DC

## 2020-02-18 ENCOUNTER — Other Ambulatory Visit: Payer: Self-pay

## 2020-02-18 MED ORDER — AMPHETAMINE-DEXTROAMPHETAMINE 20 MG PO TABS: 20.0000 mg | ORAL_TABLET | Freq: Two times a day (BID) | ORAL | 0 refills | Status: DC

## 2020-02-27 ENCOUNTER — Other Ambulatory Visit: Payer: Self-pay | Admitting: Physician Assistant

## 2020-03-18 ENCOUNTER — Other Ambulatory Visit: Payer: Self-pay | Admitting: Family Medicine

## 2020-03-24 ENCOUNTER — Other Ambulatory Visit: Payer: Self-pay | Admitting: Family Medicine

## 2020-03-24 ENCOUNTER — Other Ambulatory Visit: Payer: Self-pay | Admitting: Physician Assistant

## 2020-03-24 NOTE — Telephone Encounter (Signed)
Please call for chronic fasting follow up. Needs visit before further prescriptions will be filled. Thank You, Dr. Sedalia Muta

## 2020-03-31 ENCOUNTER — Other Ambulatory Visit: Payer: Self-pay

## 2020-03-31 MED ORDER — AMPHETAMINE-DEXTROAMPHETAMINE 20 MG PO TABS
20.0000 mg | ORAL_TABLET | Freq: Two times a day (BID) | ORAL | 0 refills | Status: DC
Start: 2020-03-31 — End: 2020-04-06

## 2020-04-06 ENCOUNTER — Other Ambulatory Visit: Payer: Self-pay

## 2020-04-06 MED ORDER — AMPHETAMINE-DEXTROAMPHETAMINE 20 MG PO TABS
20.0000 mg | ORAL_TABLET | Freq: Two times a day (BID) | ORAL | 0 refills | Status: DC
Start: 2020-04-06 — End: 2020-04-12

## 2020-04-12 ENCOUNTER — Other Ambulatory Visit: Payer: Self-pay

## 2020-04-12 MED ORDER — AMPHETAMINE-DEXTROAMPHETAMINE 20 MG PO TABS
20.0000 mg | ORAL_TABLET | Freq: Two times a day (BID) | ORAL | 0 refills | Status: DC
Start: 2020-04-12 — End: 2020-05-10

## 2020-05-07 ENCOUNTER — Encounter: Payer: Self-pay | Admitting: Nurse Practitioner

## 2020-05-07 ENCOUNTER — Ambulatory Visit: Payer: BC Managed Care – PPO | Admitting: Nurse Practitioner

## 2020-05-07 ENCOUNTER — Other Ambulatory Visit: Payer: Self-pay

## 2020-05-07 VITALS — BP 112/72 | HR 78 | Temp 97.4°F | Ht 67.0 in | Wt 193.0 lb

## 2020-05-07 DIAGNOSIS — I10 Essential (primary) hypertension: Secondary | ICD-10-CM | POA: Diagnosis not present

## 2020-05-07 DIAGNOSIS — E782 Mixed hyperlipidemia: Secondary | ICD-10-CM

## 2020-05-07 DIAGNOSIS — Z23 Encounter for immunization: Secondary | ICD-10-CM | POA: Diagnosis not present

## 2020-05-07 DIAGNOSIS — J4540 Moderate persistent asthma, uncomplicated: Secondary | ICD-10-CM

## 2020-05-07 DIAGNOSIS — F329 Major depressive disorder, single episode, unspecified: Secondary | ICD-10-CM

## 2020-05-07 DIAGNOSIS — K219 Gastro-esophageal reflux disease without esophagitis: Secondary | ICD-10-CM | POA: Diagnosis not present

## 2020-05-07 DIAGNOSIS — R7301 Impaired fasting glucose: Secondary | ICD-10-CM

## 2020-05-07 NOTE — Patient Instructions (Addendum)
Continue medications Continue heart-healthy diet Continue physical activity We will call you with your labs Return in 61-month for follow-up  Preventive Care [age]Years Old, Female Preventive care refers to visits with your health care provider and lifestyle choices that can promote health and wellness. This includes:  A yearly physical exam. This may also be called an annual well check.  Regular dental visits and eye exams.  Immunizations.  Screening for certain conditions.  Healthy lifestyle choices, such as eating a healthy diet, getting regular exercise, not using drugs or products that contain nicotine and tobacco, and limiting alcohol use. What can I expect for my preventive care visit? Physical exam Your health care provider will check your:  Height and weight. This may be used to calculate body mass index (BMI), which tells if you are at a healthy weight.  Heart rate and blood pressure.  Skin for abnormal spots. Counseling Your health care provider may ask you questions about your:  Alcohol, tobacco, and drug use.  Emotional well-being.  Home and relationship well-being.  Sexual activity.  Eating habits.  Work and work [employer]istician  Method of birth control.  Menstrual cycle.  Pregnancy history. What immunizations do I need?  Influenza (flu) vaccine  This is recommended every year. Tetanus, diphtheria, and pertussis (Tdap) vaccine  You may need a Td booster every 10 years. Varicella (chickenpox) vaccine  You may need this if you have not been vaccinated. Zoster (shingles) vaccine  You may need this after age 8953 Measles, mumps, and rubella (MMR) vaccine  You may need at least one dose of MMR if you were born in 1958 or later. You may also need a second dose. Pneumococcal conjugate (PCV13) vaccine  You may need this if you have certain conditions and were not previously vaccinated. Pneumococcal polysaccharide (PPSV23) vaccine  You may need  one or two doses if you smoke cigarettes or if you have certain conditions. Meningococcal conjugate (MenACWY) vaccine  You may need this if you have certain conditions. Hepatitis A vaccine  You may need this if you have certain conditions or if you travel or work in places where you may be exposed to hepatitis A. Hepatitis B vaccine  You may need this if you have certain conditions or if you travel or work in places where you may be exposed to hepatitis B. Haemophilus influenzae type b (Hib) vaccine  You may need this if you have certain conditions. Human papillomavirus (HPV) vaccine  If recommended by your health care provider, you may need three doses over 6 months. You may receive vaccines as individual doses or as more than one vaccine together in one shot (combination vaccines). Talk with your health care provider about the risks and benefits of combination vaccines. What tests do I need? Blood tests  Lipid and cholesterol levels. These may be checked every 5 years, or more frequently if you are over 534years old.  Hepatitis C test.  Hepatitis B test. Screening  Lung cancer screening. You may have this screening every year starting at age 2858if you have a 30-pack-year history of smoking and currently smoke or have quit within the past 15 years.  Colorectal cancer screening. All adults should have this screening starting at age 2850and continuing until age 314 Your health care provider may recommend screening at age 2524if you are at increased risk. You will have tests every 1-10 years, depending on your results and the type of screening test.  Diabetes screening. This  is done by checking your blood sugar (glucose) after you have not eaten for a while (fasting). You may have this done every 1-3 years.  Mammogram. This may be done every 1-2 years. Talk with your health care provider about when you should start having regular mammograms. This may depend on whether you have a family  history of breast cancer.  BRCA-related cancer screening. This may be done if you have a family history of breast, ovarian, tubal, or peritoneal cancers.  Pelvic exam and Pap test. This may be done every 3 years starting at age 58. Starting at age 57, this may be done every 5 years if you have a Pap test in combination with an HPV test. Other tests  Sexually transmitted disease (STD) testing.  Bone density scan. This is done to screen for osteoporosis. You may have this scan if you are at high risk for osteoporosis. Follow these instructions at home: Eating and drinking  Eat a diet that includes fresh fruits and vegetables, whole grains, lean protein, and low-fat dairy.  Take vitamin and mineral supplements as recommended by your health care provider.  Do not drink alcohol if: ? Your health care provider tells you not to drink. ? You are pregnant, may be pregnant, or are planning to become pregnant.  If you drink alcohol: ? Limit how much you have to 0-1 drink a day. ? Be aware of how much alcohol is in your drink. In the [place] one drink equals one 12 oz bottle of beer (355 mL), one 5 oz glass of wine (148 mL), or one 1 oz glass of hard liquor (44 mL). Lifestyle  Take daily care of your teeth and gums.  Stay active. Exercise for at least 30 minutes on 5 or more days each week.  Do not use any products that contain nicotine or tobacco, such as cigarettes, e-cigarettes, and chewing tobacco. If you need help quitting, ask your health care provider.  If you are sexually active, practice safe sex. Use a condom or other form of birth control (contraception) in order to prevent pregnancy and STIs (sexually transmitted infections).  If told by your health care provider, take low-dose aspirin daily starting at age 24. What's next?  Visit your health care provider once a year for a well check visit.  Ask your health care provider how often you should have your eyes and teeth  checked.  Stay up to date on all vaccines. This information is not intended to replace advice given to you by your health care provider. Make sure you discuss any questions you have with your health care provider. Document Revised: 01/31/2018 Document Reviewed: 01/31/2018 Elsevier Patient Education  [employer]. It is important to avoid accidents which may result in broken bones.  Here are a few ideas on how to make your home safer so you will be less likely to trip or fall.  1. Use nonskid mats or non slip strips in your shower or tub, on your bathroom floor and around sinks.  If you know that you have spilled water, wipe it up! 2. In the bathroom, it is important to have properly installed grab bars on the walls or on the edge of the tub.  Towel racks are NOT strong enough for you to hold onto or to pull on for support. 3. Stairs and hallways should have enough light.  Add lamps or night lights if you need ore light. 4. It is good to have handrails on both  sides of the stairs if possible.  Always fix broken handrails right away. 5. It is important to see the edges of steps.  Paint the edges of outdoor steps white so you can see them better.  Put colored tape on the edge of inside steps. 6. Throw-rugs are dangerous because they can slide.  Removing the rugs is the best idea, but if they must stay, add adhesive carpet tape to prevent slipping. 7. Do not keep things on stairs or in the halls.  Remove small furniture that blocks the halls as it may cause you to trip.  Keep telephone and electrical cords out of the way where you walk. 8. Always were sturdy, rubber-soled shoes for good support.  Never wear just socks, especially on the stairs.  Socks may cause you to slip or fall.  Do not wear full-length housecoats as you can easily trip on the bottom.  9. Place the things you use the most on the shelves that are the easiest to reach.  If you use a stepstool, make sure it is in good condition.   If you feel unsteady, DO NOT climb, ask for help. If a health professional advises you to use a cane or walker, do not be ashamed.  These items can keep you from falling and breaking your bones.High Cholesterol  High cholesterol is a condition in which the blood has high levels of a white, waxy, fat-like substance (cholesterol). The human body needs small amounts of cholesterol. The liver makes all the cholesterol that the body needs. Extra (excess) cholesterol comes from the food that we eat. Cholesterol is carried from the liver by the blood through the blood vessels. If you have high cholesterol, deposits (plaques) may build up on the walls of your blood vessels (arteries). Plaques make the arteries narrower and stiffer. Cholesterol plaques increase your risk for heart attack and stroke. Work with your health care provider to keep your cholesterol levels in a healthy range. What increases the risk? This condition is more likely to develop in people who:  Eat foods that are high in animal fat (saturated fat) or cholesterol.  Are overweight.  Are not getting enough exercise.  Have a family history of high cholesterol. What are the signs or symptoms? There are no symptoms of this condition. How is this diagnosed? This condition may be diagnosed from the results of a blood test.  If you are older than age 17, your health care provider may check your cholesterol every 4-6 years.  You may be checked more often if you already have high cholesterol or other risk factors for heart disease. The blood test for cholesterol measures:  "Bad" cholesterol (LDL cholesterol). This is the main type of cholesterol that causes heart disease. The desired level for LDL is less than 100.  "Good" cholesterol (HDL cholesterol). This type helps to protect against heart disease by cleaning the arteries and carrying the LDL away. The desired level for HDL is 60 or higher.  Triglycerides. These are fats that the  body can store or burn for energy. The desired number for triglycerides is lower than 150.  Total cholesterol. This is a measure of the total amount of cholesterol in your blood, including LDL cholesterol, HDL cholesterol, and triglycerides. A healthy number is less than 200. How is this treated? This condition is treated with diet changes, lifestyle changes, and medicines. Diet changes  This may include eating more whole grains, fruits, vegetables, nuts, and fish.  This may also  include cutting back on red meat and foods that have a lot of added sugar. Lifestyle changes  Changes may include getting at least 40 minutes of aerobic exercise 3 times a week. Aerobic exercises include walking, biking, and swimming. Aerobic exercise along with a healthy diet can help you maintain a healthy weight.  Changes may also include quitting smoking. Medicines  Medicines are usually given if diet and lifestyle changes have failed to reduce your cholesterol to healthy levels.  Your health care provider may prescribe a statin medicine. Statin medicines have been shown to reduce cholesterol, which can reduce the risk of heart disease. Follow these instructions at home: Eating and drinking If told by your health care provider:  Eat chicken (without skin), fish, veal, shellfish, ground [place] breast, and round or loin cuts of red meat.  Do not eat fried foods or fatty meats, such as hot dogs and salami.  Eat plenty of fruits, such as apples.  Eat plenty of vegetables, such as broccoli, potatoes, and carrots.  Eat beans, peas, and lentils.  Eat grains such as barley, rice, couscous, and bulgur wheat.  Eat pasta without cream sauces.  Use skim or nonfat milk, and eat low-fat or nonfat yogurt and cheeses.  Do not eat or drink whole milk, cream, ice cream, egg yolks, or hard cheeses.  Do not eat stick margarine or tub margarines that contain trans fats (also called partially hydrogenated  oils).  Do not eat saturated tropical oils, such as coconut oil and palm oil.  Do not eat cakes, cookies, crackers, or other baked goods that contain trans fats.  General instructions  Exercise as directed by your health care provider. Increase your activity level with activities such as gardening, walking, and taking the stairs.  Take over-the-counter and prescription medicines only as told by your health care provider.  Do not use any products that contain nicotine or tobacco, such as cigarettes and e-cigarettes. If you need help quitting, ask your health care provider.  Keep all follow-up visits as told by your health care provider. This is important. Contact a health care provider if:  You are struggling to maintain a healthy diet or weight.  You need help to start on an exercise program.  You need help to stop smoking. Get help right away if:  You have chest pain.  You have trouble breathing. This information is not intended to replace advice given to you by your health care provider. Make sure you discuss any questions you have with your health care provider. Document Revised: 05/25/2017 Document Reviewed: 11/20/2015 Elsevier Patient Education  [place] [employer], Female Adopting a healthy lifestyle and getting preventive care are important in promoting health and wellness. Ask your health care provider about:  The right schedule for you to have regular tests and exams.  Things you can do on your own to prevent diseases and keep yourself healthy. What should I know about diet, weight, and exercise? Eat a healthy diet   Eat a diet that includes plenty of vegetables, fruits, low-fat dairy products, and lean protein.  Do not eat a lot of foods that are high in solid fats, added sugars, or sodium. Maintain a healthy weight Body mass index (BMI) is used to identify weight problems. It estimates body fat based on height and weight. Your health care  provider can help determine your BMI and help you achieve or maintain a healthy weight. Get regular exercise Get regular exercise. This is one of the  most important things you can do for your health. Most adults should:  Exercise for at least 150 minutes each week. The exercise should increase your heart rate and make you sweat (moderate-intensity exercise).  Do strengthening exercises at least twice a week. This is in addition to the moderate-intensity exercise.  Spend less time sitting. Even light physical activity can be beneficial. Watch cholesterol and blood lipids Have your blood tested for lipids and cholesterol at 60 years of age, then have this test every 5 years. Have your cholesterol levels checked more often if:  Your lipid or cholesterol levels are high.  You are older than 60 years of age.  You are at high risk for heart disease. What should I know about cancer screening? Depending on your health history and family history, you may need to have cancer screening at various ages. This may include screening for:  Breast cancer.  Cervical cancer.  Colorectal cancer.  Skin cancer.  Lung cancer. What should I know about heart disease, diabetes, and high blood pressure? Blood pressure and heart disease  High blood pressure causes heart disease and increases the risk of stroke. This is more likely to develop in people who have high blood pressure readings, are of African descent, or are overweight.  Have your blood pressure checked: ? Every 3-5 years if you are 45-50 years of age. ? Every year if you are 52 years old or older. Diabetes Have regular diabetes screenings. This checks your fasting blood sugar level. Have the screening done:  Once every three years after age 40 if you are at a normal weight and have a low risk for diabetes.  More often and at a younger age if you are overweight or have a high risk for diabetes. What should I know about preventing  infection? Hepatitis B If you have a higher risk for hepatitis B, you should be screened for this virus. Talk with your health care provider to find out if you are at risk for hepatitis B infection. Hepatitis C Testing is recommended for:  Everyone born from [birth year] through 1966.  Anyone with known risk factors for hepatitis C. Sexually transmitted infections (STIs)  Get screened for STIs, including gonorrhea and chlamydia, if: ? You are sexually active and are younger than 60 years of age. ? You are older than 60 years of age and your health care provider tells you that you are at risk for this type of infection. ? Your sexual activity has changed since you were last screened, and you are at increased risk for chlamydia or gonorrhea. Ask your health care provider if you are at risk.  Ask your health care provider about whether you are at high risk for HIV. Your health care provider may recommend a prescription medicine to help prevent HIV infection. If you choose to take medicine to prevent HIV, you should first get tested for HIV. You should then be tested every 3 months for as long as you are taking the medicine. Pregnancy  If you are about to stop having your period (premenopausal) and you may become pregnant, seek counseling before you get pregnant.  Take 400 to 800 micrograms (mcg) of folic acid every day if you become pregnant.  Ask for birth control (contraception) if you want to prevent pregnancy. Osteoporosis and menopause Osteoporosis is a disease in which the bones lose minerals and strength with aging. This can result in bone fractures. If you are 55 years old or older, or if you  are at risk for osteoporosis and fractures, ask your health care provider if you should:  Be screened for bone loss.  Take a calcium or vitamin D supplement to lower your risk of fractures.  Be given hormone replacement therapy (HRT) to treat symptoms of menopause. Follow these instructions at  home: Lifestyle  Do not use any products that contain nicotine or tobacco, such as cigarettes, e-cigarettes, and chewing tobacco. If you need help quitting, ask your health care provider.  Do not use street drugs.  Do not share needles.  Ask your health care provider for help if you need support or information about quitting drugs. Alcohol use  Do not drink alcohol if: ? Your health care provider tells you not to drink. ? You are pregnant, may be pregnant, or are planning to become pregnant.  If you drink alcohol: ? Limit how much you use to 0-1 drink a day. ? Limit intake if you are breastfeeding.  Be aware of how much alcohol is in your drink. In the [place] one drink equals one 12 oz bottle of beer (355 mL), one 5 oz glass of wine (148 mL), or one 1 oz glass of hard liquor (44 mL). General instructions  Schedule regular health, dental, and eye exams.  Stay current with your vaccines.  Tell your health care provider if: ? You often feel depressed. ? You have ever been abused or do not feel safe at home. Summary  Adopting a healthy lifestyle and getting preventive care are important in promoting health and wellness.  Follow your health care provider's instructions about healthy diet, exercising, and getting tested or screened for diseases.  Follow your health care provider's instructions on monitoring your cholesterol and blood pressure. This information is not intended to replace advice given to you by your health care provider. Make sure you discuss any questions you have with your health care provider. Document Revised: 05/15/2018 Document Reviewed: 05/15/2018 Elsevier Patient Education  2020 [employer].

## 2020-05-07 NOTE — Progress Notes (Signed)
Subjective:  Patient ID: Brittany Walton, female    DOB: 20-Sep-1960  Age: 59 y.o. MRN: [id number]  Chief Complaint  Patient presents with  . Hypertension    Fasting follow up  . Hyperlipidemia    HPI   Brittany Walton is a 59 year old Caucasian female present for follow-up of asthma, hypertension, and hyperlipidemia. [name] experienced a fall 7-days ago after a holiday family gathering. She stumbled on a concrete pad while walking outdoors at her father's house. Her hands became entangled in a shawl she was wearing. She landed on her face. She suffered a fractured nose, lacerations to her nose and lip, and bruising under bilateral eyes. She was seen at Urgent Care after the fall. She did not require surgery or laceration repair.      Asthma Brittany Walton has a history of asthma since childhood. She is currently well-controlled with Symbicort BID, Singulair 10 mg daily, Albuterol nebulizer and inhaler PRN. Pt denies recent exacerbations, coughs, or illnesses. She has received a flu vaccine today in the office. She has received two COVID-19 immunizations. States she will obtain COVID-19 booster at [facility].   Hypertension Brittany Walton has a history of hypertension for over a year. She is currently well-controlled with Benicar 20 mg daily. BP today 112/72. She denies chest pain, headache, shortness of breath. She is adherent with medication and appointment follow-up. She is trying to eat a heart healthy diet but is not currently exercising regularly.   Hyperlipidemia Brittany Walton has a history of hyperlipidemia for over a year. She is moderately controlled but not at goal. Last lipid panel on 06/23/19 revealed TC: 210, Trig 185, HDL 53, and LDL 121. Current treatment includes Zetia 10 mg daily and heart healthy diet. She is adherent to medication and follow-up appointments. She denies myalgias or arthralgias related to medication.    Current Outpatient Medications on File Prior to Visit  Medication Sig Dispense Refill  .  albuterol (PROAIR HFA) 108 (90 Base) MCG/ACT inhaler Inhale 1-2 puffs into the lungs every 6 (six) hours as needed for wheezing or shortness of breath.    Marland Kitchen albuterol (PROVENTIL) (2.5 MG/3ML) 0.083% nebulizer solution Take 2.5 mg by nebulization every 6 (six) hours as needed for wheezing or shortness of breath.    . amphetamine-dextroamphetamine (ADDERALL) 20 MG tablet Take 1 tablet (20 mg total) by mouth 2 (two) times daily. 60 tablet 0  . budesonide-formoterol (SYMBICORT) 160-4.5 MCG/ACT inhaler Inhale 2 puffs into the lungs 2 (two) times daily.    . diclofenac (VOLTAREN) 75 MG EC tablet Take 75 mg by mouth 2 (two) times daily as needed.    . ezetimibe (ZETIA) 10 MG tablet TAKE ONE (1) TABLET ONCE DAILY 90 tablet 0  . IBU 800 MG tablet TAKE 1 TABLET 3 TIMES DAILY AS NEEDED WITH FOOD 90 tablet 1  . montelukast (SINGULAIR) 10 MG tablet TAKE ONE (1) TABLET AT BEDTIME 90 tablet 3  . olmesartan (BENICAR) 20 MG tablet Take 1 tablet (20 mg total) by mouth daily. Please call for chronic fasting follow up. Needs visit before further prescriptions will be filled. Thank You, Dr. Sedalia Muta 90 tablet 0  . omeprazole (PRILOSEC) 40 MG capsule Take 1 capsule (40 mg total) by mouth 2 (two) times daily. 180 capsule 3  . sertraline (ZOLOFT) 50 MG tablet TAKE ONE (1) TABLET ONCE DAILY 90 tablet 0   No current facility-administered medications on file prior to visit.   Past Medical History:  Diagnosis Date  . ADHD   .  Asthma   . Biliary dyskinesia   . Depression   . GERD (gastroesophageal reflux disease)   . Hyperlipidemia   . Hypertension   . Impaired fasting glucose   . Obesity    Past Surgical History:  Procedure Laterality Date  . ABDOMINAL HYSTERECTOMY  2006   patrial for uterine prolapse  . HERNIA REPAIR  2009   umbilical, bilateral inguinal    Family History  Problem Relation Age of Onset  . Heart attack Mother   . Dementia Mother    Social History   Socioeconomic History  . Marital status:  Married    Spouse name: Not on file  . Number of children: 2  . Years of education: Not on file  . Highest education level: Not on file  Occupational History  . Occupation: Runner, broadcasting/film/video- [employer]  Tobacco Use  . Smoking status: Never Smoker  . Smokeless tobacco: Never Used  Vaping Use  . Vaping Use: Never used  Substance and Sexual Activity  . Alcohol use: Yes    Comment: Drinks alcohol very infrequently. She typically consumes wine.  . Drug use: Never  . Sexual activity: Not on file  Other Topics Concern  . Not on file  Social History Narrative  . Not on file   Social Determinants of Health   Financial Resource Strain:   . Difficulty of Paying Living Expenses: Not on file  Food Insecurity:   . Worried About Programme researcher, broadcasting/film/video in the Last Year: Not on file  . Ran Out of Food in the Last Year: Not on file  Transportation Needs:   . Lack of Transportation (Medical): Not on file  . Lack of Transportation (Non-Medical): Not on file  Physical Activity:   . Days of Exercise per Week: Not on file  . Minutes of Exercise per Session: Not on file  Stress:   . Feeling of Stress : Not on file  Social Connections:   . Frequency of Communication with Friends and Family: Not on file  . Frequency of Social Gatherings with Friends and Family: Not on file  . Attends Religious Services: Not on file  . Active Member of Clubs or Organizations: Not on file  . Attends Banker Meetings: Not on file  . Marital Status: Not on file    Review of Systems  Constitutional: Negative for fatigue and fever.  HENT: Negative for congestion, ear pain, sinus pressure and sore throat.   Eyes: Negative for pain.  Respiratory: Negative for cough, chest tightness, shortness of breath and wheezing.   Cardiovascular: Negative for chest pain and palpitations.  Gastrointestinal: Negative for abdominal pain, constipation, diarrhea, nausea and vomiting.  Genitourinary: Negative for  dysuria and hematuria.  Musculoskeletal: Negative for arthralgias, back pain, joint swelling and myalgias.  Skin: Positive for wound. Negative for rash.  Neurological: Negative for dizziness, weakness and headaches.  Psychiatric/Behavioral: Negative for dysphoric mood. The patient is not nervous/anxious.      Objective:  BP 112/72 (BP Location: Left Arm, Patient Position: Sitting)   Pulse 78   Temp (!) 97.4 F (36.3 C) (Temporal)   Ht 5\' 7"  (1.702 m)   Wt 193 lb (87.5 kg)   SpO2 96%   BMI 30.23 kg/m   BP/Weight 05/07/2020 11/28/2019 10/08/2019  Systolic BP 112 110 116  Diastolic BP 72 78 78  Wt. (Lbs) 193 184 184  BMI 30.23 28.82 28.82    Physical Exam Vitals reviewed.  Constitutional:  Appearance: Normal appearance.  HENT:     Head: Normocephalic. Raccoon eyes (healing bruises under bilateral eyes) and abrasion present.      Right Ear: Tympanic membrane, ear canal and external ear normal.     Left Ear: Tympanic membrane, ear canal and external ear normal.     Nose: Nose normal.     Mouth/Throat:     Mouth: Mucous membranes are moist.  Cardiovascular:     Rate and Rhythm: Normal rate and regular rhythm.     Pulses: Normal pulses.     Heart sounds: Normal heart sounds.  Pulmonary:     Effort: Pulmonary effort is normal.     Breath sounds: Normal breath sounds.  Abdominal:     General: Bowel sounds are normal.     Palpations: Abdomen is soft.  Musculoskeletal:        General: Tenderness (left hand/wrist) present.     Cervical back: Normal range of motion.  Skin:    General: Skin is warm and dry.     Capillary Refill: Capillary refill takes less than 2 seconds.  Neurological:     General: No focal deficit present.     Mental Status: She is alert and oriented to person, place, and time.  Psychiatric:        Mood and Affect: Mood normal.        Behavior: Behavior normal.        Thought Content: Thought content normal.        Judgment: Judgment normal.      Diabetic Foot Exam - Simple   No data filed         Assessment & Plan:   1. Primary hypertension-well controlled - CBC With Diff/Platelet - Comprehensive metabolic panel -Continue Benicar 20 mg daily  2. Mixed hyperlipidemia-moderately controlled, not yet at goal - Lipid panel -Continue Zetia 10 mg daily -Continue heart healthy diet -Increase physical activity  3. Gastroesophageal reflux disease without esophagitis-well controlled  -Continue Prilosec 40 mg BID -Avoid GERD triggers  4. Major depressive disorder with single episode, remission status unspecified-well controlled -Continue Zoloft 50 mg daily  5. Impaired fasting glucose - Hemoglobin A1c  6. Need for vaccination - Flu Vaccine MDCK QUAD PF   7. Asthma -Continue Symbicort  -Continue Singulair 10 mg daily -Continue Albuterol inhaler/nebulizer PRN    Orders Placed This Encounter  Procedures  . Flu Vaccine MDCK QUAD PF  . CBC With Diff/Platelet  . Comprehensive metabolic panel  . Lipid panel  . Hemoglobin A1c     Continue medications Continue heart-healthy diet Continue physical activity We will call you with your labs Return in 37-months for follow-up     Follow-up: 18-months  An After Visit Summary was printed and given to the patient.  Janie Morning, NP [facility] [phone]

## 2020-05-08 LAB — CBC WITH DIFF/PLATELET
Basophils Absolute: 0.1 10*3/uL (ref 0.0–0.2)
Basos: 1 %
EOS (ABSOLUTE): 0.4 10*3/uL (ref 0.0–0.4)
Eos: 6 %
Hematocrit: 43.4 % (ref 34.0–46.6)
Hemoglobin: 14.2 g/dL (ref 11.1–15.9)
Immature Grans (Abs): 0 10*3/uL (ref 0.0–0.1)
Immature Granulocytes: 0 %
Lymphocytes Absolute: 1.9 10*3/uL (ref 0.7–3.1)
Lymphs: 29 %
MCH: 29.4 pg (ref 26.6–33.0)
MCHC: 32.7 g/dL (ref 31.5–35.7)
MCV: 90 fL (ref 79–97)
Monocytes Absolute: 0.5 10*3/uL (ref 0.1–0.9)
Monocytes: 8 %
Neutrophils Absolute: 3.6 10*3/uL (ref 1.4–7.0)
Neutrophils: 56 %
Platelets: 331 10*3/uL (ref 150–450)
RBC: 4.83 x10E6/uL (ref 3.77–5.28)
RDW: 14.1 % (ref 11.7–15.4)
WBC: 6.5 10*3/uL (ref 3.4–10.8)

## 2020-05-08 LAB — COMPREHENSIVE METABOLIC PANEL
ALT: 30 IU/L (ref 0–32)
AST: 25 IU/L (ref 0–40)
Albumin/Globulin Ratio: 1.8 (ref 1.2–2.2)
Albumin: 4.8 g/dL (ref 3.8–4.9)
Alkaline Phosphatase: 107 IU/L (ref 44–121)
BUN/Creatinine Ratio: 14 (ref 12–28)
BUN: 10 mg/dL (ref 8–27)
Bilirubin Total: <0.2 mg/dL (ref 0.0–1.2)
CO2: 24 mmol/L (ref 20–29)
Calcium: 9.8 mg/dL (ref 8.7–10.3)
Chloride: 98 mmol/L (ref 96–106)
Creatinine, Ser: 0.69 mg/dL (ref 0.57–1.00)
GFR calc Af Amer: 109 mL/min/{1.73_m2} (ref >59)
GFR calc non Af Amer: 95 mL/min/{1.73_m2} (ref >59)
Globulin, Total: 2.7 g/dL (ref 1.5–4.5)
Glucose: 102 mg/dL — ABNORMAL HIGH (ref 65–99)
Potassium: 5.1 mmol/L (ref 3.5–5.2)
Sodium: 138 mmol/L (ref 134–144)
Total Protein: 7.5 g/dL (ref 6.0–8.5)

## 2020-05-08 LAB — LIPID PANEL
Chol/HDL Ratio: 3.1 ratio (ref 0.0–4.4)
Cholesterol, Total: 208 mg/dL — ABNORMAL HIGH (ref 100–199)
HDL: 68 mg/dL (ref >39)
LDL Chol Calc (NIH): 122 mg/dL — ABNORMAL HIGH (ref 0–99)
Triglycerides: 101 mg/dL (ref 0–149)
VLDL Cholesterol Cal: 18 mg/dL (ref 5–40)

## 2020-05-08 LAB — CARDIOVASCULAR RISK ASSESSMENT

## 2020-05-08 LAB — HEMOGLOBIN A1C
Est. average glucose Bld gHb Est-mCnc: 131 mg/dL
Hgb A1c MFr Bld: 6.2 % — ABNORMAL HIGH (ref 4.8–5.6)

## 2020-05-10 ENCOUNTER — Other Ambulatory Visit: Payer: Self-pay | Admitting: Nurse Practitioner

## 2020-05-10 DIAGNOSIS — F900 Attention-deficit hyperactivity disorder, predominantly inattentive type: Secondary | ICD-10-CM

## 2020-05-10 MED ORDER — AMPHETAMINE-DEXTROAMPHETAMINE 20 MG PO TABS: 20.0000 mg | ORAL_TABLET | Freq: Two times a day (BID) | ORAL | 0 refills | Status: DC

## 2020-05-18 ENCOUNTER — Other Ambulatory Visit: Payer: Self-pay

## 2020-05-18 DIAGNOSIS — F900 Attention-deficit hyperactivity disorder, predominantly inattentive type: Secondary | ICD-10-CM

## 2020-05-18 MED ORDER — AMPHETAMINE-DEXTROAMPHETAMINE 20 MG PO TABS: 20.0000 mg | ORAL_TABLET | Freq: Two times a day (BID) | ORAL | 0 refills | Status: DC

## 2020-06-07 ENCOUNTER — Other Ambulatory Visit: Payer: Self-pay | Admitting: Family Medicine

## 2020-06-10 ENCOUNTER — Other Ambulatory Visit: Payer: Self-pay

## 2020-06-10 ENCOUNTER — Ambulatory Visit (INDEPENDENT_AMBULATORY_CARE_PROVIDER_SITE_OTHER): Payer: BC Managed Care – PPO

## 2020-06-10 DIAGNOSIS — Z23 Encounter for immunization: Secondary | ICD-10-CM | POA: Diagnosis not present

## 2020-06-10 NOTE — Progress Notes (Signed)
   Covid-19 Vaccination Clinic  Name:  KAMAYAH PILLAY    MRN: [id number] DOB: 1961/03/20  06/10/2020  Ms. Volante was observed post Covid-19 immunization for 15 minutes without incident. She was provided with Vaccine Information Sheet and instruction to access the V-Safe system.   Ms. Rochefort was instructed to call 911 with any severe reactions post vaccine: Marland Kitchen Difficulty breathing  . Swelling of face and throat  . A fast heartbeat  . A bad rash all over body  . Dizziness and weakness   Immunizations Administered    Name Date Dose VIS Date Route   Pfizer COVID-19 Vaccine 06/10/2020  2:15 PM 0.3 mL 03/24/2020 Intramuscular   Manufacturer: [employer], [employer]   Lot: [id number]   NDC: [address]-7

## 2020-06-24 ENCOUNTER — Other Ambulatory Visit: Payer: Self-pay | Admitting: Family Medicine

## 2020-06-28 ENCOUNTER — Other Ambulatory Visit: Payer: Self-pay | Admitting: Family Medicine

## 2020-07-01 ENCOUNTER — Other Ambulatory Visit: Payer: Self-pay | Admitting: Family Medicine

## 2020-07-05 ENCOUNTER — Telehealth (INDEPENDENT_AMBULATORY_CARE_PROVIDER_SITE_OTHER): Payer: BC Managed Care – PPO | Admitting: Family Medicine

## 2020-07-05 DIAGNOSIS — J45901 Unspecified asthma with (acute) exacerbation: Secondary | ICD-10-CM

## 2020-07-05 DIAGNOSIS — J189 Pneumonia, unspecified organism: Secondary | ICD-10-CM | POA: Diagnosis not present

## 2020-07-05 MED ORDER — CEFDINIR 300 MG PO CAPS
300.0000 mg | ORAL_CAPSULE | Freq: Two times a day (BID) | ORAL | 0 refills | Status: DC
Start: 2020-07-05 — End: 2020-08-06

## 2020-07-05 MED ORDER — AZITHROMYCIN 250 MG PO TABS
ORAL_TABLET | ORAL | 0 refills | Status: DC
Start: 2020-07-05 — End: 2020-07-09

## 2020-07-05 MED ORDER — PREDNISONE 10 MG (21) PO TBPK: ORAL_TABLET | ORAL | 0 refills | Status: DC

## 2020-07-05 NOTE — Progress Notes (Signed)
Virtual Visit via Video Note   This visit type was conducted due to national recommendations for restrictions regarding the COVID-19 Pandemic (e.g. social distancing) in an effort to limit this patient's exposure and mitigate transmission in our community.  Due to her co-morbid illnesses, this patient is at least at moderate risk for complications without adequate follow up.  This format is felt to be most appropriate for this patient at this time.  All issues noted in this document were discussed and addressed.  A limited physical exam was performed with this format.  A verbal consent was obtained for the virtual visit.   Patient Location:home Provider Location:office Evaluation Performed: acute Acute Office Visit  Subjective:    Patient ID: Brittany Walton, female    DOB: Apr 22, 1961, 60 y.o.   MRN: [id number]  Chief Complaint  Patient presents with  . Shortness of Breath  . Asthma    HPI Patient is a 60 yo WF with history of Asthma for worsening dypsnea.  Symptoms started 2 weeks ago. Has been using nebulizer-albuterol every 2-3 days. Using symbicort 2 puffs BID and safetussin guaifenesin dextromethorphan. Has cough, sob, wheezing, chest tightness.   Past Medical History:  Diagnosis Date  . ADHD   . Asthma   . Biliary dyskinesia   . Depression   . GERD (gastroesophageal reflux disease)   . Hyperlipidemia   . Hypertension   . Impaired fasting glucose   . Obesity     Past Surgical History:  Procedure Laterality Date  . ABDOMINAL HYSTERECTOMY  2006   patrial for uterine prolapse  . HERNIA REPAIR  2009   umbilical, bilateral inguinal    Family History  Problem Relation Age of Onset  . Heart attack Mother   . Dementia Mother     Social History   Socioeconomic History  . Marital status: Married    Spouse name: Not on file  . Number of children: 2  . Years of education: Not on file  . Highest education level: Not on file  Occupational History  . Occupation:  Runner, broadcasting/film/video- [employer]  Tobacco Use  . Smoking status: Never Smoker  . Smokeless tobacco: Never Used  Vaping Use  . Vaping Use: Never used  Substance and Sexual Activity  . Alcohol use: Yes    Comment: Drinks alcohol very infrequently. She typically consumes wine.  . Drug use: Never  . Sexual activity: Not on file  Other Topics Concern  . Not on file  Social History Narrative  . Not on file   Social Determinants of Health   Financial Resource Strain: Not on file  Food Insecurity: Not on file  Transportation Needs: Not on file  Physical Activity: Not on file  Stress: Not on file  Social Connections: Not on file  Intimate Partner Violence: Not on file    Outpatient Medications Prior to Visit  Medication Sig Dispense Refill  . albuterol (PROVENTIL) (2.5 MG/3ML) 0.083% nebulizer solution Take 2.5 mg by nebulization every 6 (six) hours as needed for wheezing or shortness of breath.    Marland Kitchen albuterol (VENTOLIN HFA) 108 (90 Base) MCG/ACT inhaler Inhale 1-2 puffs into the lungs every 6 (six) hours as needed for wheezing or shortness of breath.    . amphetamine-dextroamphetamine (ADDERALL) 20 MG tablet Take 1 tablet (20 mg total) by mouth 2 (two) times daily. 60 tablet 0  . ezetimibe (ZETIA) 10 MG tablet TAKE ONE (1) TABLET ONCE DAILY 90 tablet 0  . ibuprofen (ADVIL)  800 MG tablet TAKE 1 TABLET 3 TIMES DAILY AS NEEDED WITH FOOD 90 tablet 1  . montelukast (SINGULAIR) 10 MG tablet TAKE ONE (1) TABLET AT BEDTIME 90 tablet 3  . olmesartan (BENICAR) 20 MG tablet TAKE ONE (1) TABLET ONCE DAILY 90 tablet 0  . omeprazole (PRILOSEC) 40 MG capsule Take 1 capsule (40 mg total) by mouth 2 (two) times daily. 180 capsule 3  . sertraline (ZOLOFT) 50 MG tablet TAKE ONE (1) TABLET ONCE DAILY 90 tablet 2  . SYMBICORT 160-4.5 MCG/ACT inhaler INHALE 2 PUFFS TWICE DAILY 30.6 g 1  . diclofenac (VOLTAREN) 75 MG EC tablet Take 75 mg by mouth 2 (two) times daily as needed.     No facility-administered  medications prior to visit.    Allergies  Allergen Reactions  . Atorvastatin Other (See Comments)    Muscle cramps  . Codeine   . Crestor [Rosuvastatin] Other (See Comments)    Myalgias  . Diphenhydramine Hcl   . Elemental Sulfur     Review of Systems  Constitutional: Positive for fatigue. Negative for chills and fever.  HENT: Negative for congestion, rhinorrhea and sore throat.   Respiratory: Positive for cough, shortness of breath and wheezing.        Pain under BL breast  Cardiovascular: Positive for chest pain (left breast and ribcage pain ).  Gastrointestinal: Positive for nausea. Negative for abdominal pain, constipation, diarrhea and vomiting.  Genitourinary: Negative for dysuria and urgency.  Musculoskeletal: Negative for back pain and myalgias.  Neurological: Positive for headaches. Negative for dizziness, weakness and light-headedness.  Psychiatric/Behavioral: Negative for dysphoric mood. The patient is not nervous/anxious.        Objective:    Physical Exam Constitutional:      Comments: Disheveled  Neck:     Comments: Little dyspneic    There were no vitals taken for this visit. Wt Readings from Last 3 Encounters:  07/09/20 196 lb (88.9 kg)  05/07/20 193 lb (87.5 kg)  11/28/19 184 lb (83.5 kg)    Health Maintenance Due  Topic Date Due  . Hepatitis C Screening  Never done  . HIV Screening  Never done  . TETANUS/TDAP  Never done  . PAP SMEAR-Modifier  05/06/2015  . MAMMOGRAM  08/09/2019    There are no preventive care reminders to display for this patient.   No results found for: TSH Lab Results  Component Value Date   WBC 6.5 05/07/2020   HGB 14.2 05/07/2020   HCT 43.4 05/07/2020   MCV 90 05/07/2020   PLT 331 05/07/2020   Lab Results  Component Value Date   NA 138 05/07/2020   K 5.1 05/07/2020   CO2 24 05/07/2020   GLUCOSE 102 (H) 05/07/2020   BUN 10 05/07/2020   CREATININE 0.69 05/07/2020   BILITOT <0.2 05/07/2020   ALKPHOS 107  05/07/2020   AST 25 05/07/2020   ALT 30 05/07/2020   PROT 7.5 05/07/2020   ALBUMIN 4.8 05/07/2020   CALCIUM 9.8 05/07/2020   Lab Results  Component Value Date   CHOL 208 (H) 05/07/2020   Lab Results  Component Value Date   HDL 68 05/07/2020   Lab Results  Component Value Date   LDLCALC 122 (H) 05/07/2020   Lab Results  Component Value Date   TRIG 101 05/07/2020   Lab Results  Component Value Date   CHOLHDL 3.1 05/07/2020   Lab Results  Component Value Date   HGBA1C 6.2 (H) 05/07/2020  Assessment & Plan:  1. Moderate asthma with exacerbation, unspecified whether persistent Predpack given.  Continue use of albuterol mdi 2 puffs four times a day for 2 days then return to qid prn.   2. Pneumonia due to infectious organism, unspecified laterality, unspecified part of lung  Zpack and cefdinir given.  Symptoms have been going on for over 2 weeks so if was covid 19 would not be eligible for MAB infusion. Recommend wear mask if goes out.   Meds ordered this encounter  Medications  . predniSONE (STERAPRED UNI-PAK 21 TAB) 10 MG (21) TBPK tablet    Sig: Take 6 tablets on day 1, then 5 tablets on day 2, then 4 tablets on day 3, then 3 tablets on day 4, then 2 tablets on day 5 and 1 tablet on day 6.    Dispense:  21 tablet    Refill:  0  . DISCONTD: azithromycin (ZITHROMAX) 250 MG tablet    Sig: Take two tablets by mouth daily for 3 days    Dispense:  6 tablet    Refill:  0  . cefdinir (OMNICEF) 300 MG capsule    Sig: Take 1 capsule (300 mg total) by mouth 2 (two) times daily.    Dispense:  20 capsule    Refill:  0    Time:  Today, I have spent 8 minutes with the patient with telehealth technology discussing the above problems.  Follow Up:  In Person prn if symptoms worsening.  An After Visit Summary was printed and given to the patient.  Blane Ohara, MD [facility] [phone]

## 2020-07-09 ENCOUNTER — Ambulatory Visit: Payer: BC Managed Care – PPO | Admitting: Nurse Practitioner

## 2020-07-09 ENCOUNTER — Encounter: Payer: Self-pay | Admitting: Nurse Practitioner

## 2020-07-09 VITALS — BP 134/76 | HR 80 | Temp 97.3°F | Resp 16 | Ht 67.0 in | Wt 196.0 lb

## 2020-07-09 DIAGNOSIS — R059 Cough, unspecified: Secondary | ICD-10-CM | POA: Diagnosis not present

## 2020-07-09 LAB — POC COVID19 BINAXNOW: SARS Coronavirus 2 Ag: NEGATIVE

## 2020-07-09 NOTE — Progress Notes (Signed)
Acute Office Visit  Subjective:    Patient ID: Brittany Walton, female    DOB: 03/27/1961, 60 y.o.   MRN: [id number]  Chief Complaint  Patient presents with  . Cough    HPI Jeremy is a 60 year old Caucasian female that is in today for follow-up of respiratory infection. Onset of symptoms was 1-week ago. Pt states she is completing a course of antibiotics (Zpak and Cefdinir) and Prednisone. She has continued Symbicort and Albuterol inhalers as directed. She states she has improved since taking medication. She denies dyspnea or chest pain. She does verbalize a dry non-productive cough and mild left lateral rib pain with deep breaths. She tells me she did have back pain with coughing and deep breathing but that has resolved. COVID-19 rapid antigen test negative today.    Past Medical History:  Diagnosis Date  . ADHD   . Asthma   . Biliary dyskinesia   . Depression   . GERD (gastroesophageal reflux disease)   . Hyperlipidemia   . Hypertension   . Impaired fasting glucose   . Obesity     Past Surgical History:  Procedure Laterality Date  . ABDOMINAL HYSTERECTOMY  2006   patrial for uterine prolapse  . HERNIA REPAIR  2009   umbilical, bilateral inguinal    Family History  Problem Relation Age of Onset  . Heart attack Mother   . Dementia Mother     Social History   Socioeconomic History  . Marital status: Married    Spouse name: Not on file  . Number of children: 2  . Years of education: Not on file  . Highest education level: Not on file  Occupational History  . Occupation: Runner, broadcasting/film/video- [employer]  Tobacco Use  . Smoking status: Never Smoker  . Smokeless tobacco: Never Used  Vaping Use  . Vaping Use: Never used  Substance and Sexual Activity  . Alcohol use: Yes    Comment: Drinks alcohol very infrequently. She typically consumes wine.  . Drug use: Never  . Sexual activity: Not on file  Other Topics Concern  . Not on file  Social History Narrative  .  Not on file   Social Determinants of Health   Financial Resource Strain: Not on file  Food Insecurity: Not on file  Transportation Needs: Not on file  Physical Activity: Not on file  Stress: Not on file  Social Connections: Not on file  Intimate Partner Violence: Not on file    Outpatient Medications Prior to Visit  Medication Sig Dispense Refill  . albuterol (PROVENTIL) (2.5 MG/3ML) 0.083% nebulizer solution Take 2.5 mg by nebulization every 6 (six) hours as needed for wheezing or shortness of breath.    Marland Kitchen albuterol (VENTOLIN HFA) 108 (90 Base) MCG/ACT inhaler Inhale 1-2 puffs into the lungs every 6 (six) hours as needed for wheezing or shortness of breath.    . amphetamine-dextroamphetamine (ADDERALL) 20 MG tablet Take 1 tablet (20 mg total) by mouth 2 (two) times daily. 60 tablet 0  . cefdinir (OMNICEF) 300 MG capsule Take 1 capsule (300 mg total) by mouth 2 (two) times daily. 20 capsule 0  . ezetimibe (ZETIA) 10 MG tablet TAKE ONE (1) TABLET ONCE DAILY 90 tablet 0  . ibuprofen (ADVIL) 800 MG tablet TAKE 1 TABLET 3 TIMES DAILY AS NEEDED WITH FOOD 90 tablet 1  . montelukast (SINGULAIR) 10 MG tablet TAKE ONE (1) TABLET AT BEDTIME 90 tablet 3  . olmesartan (BENICAR) 20 MG tablet TAKE ONE (  1) TABLET ONCE DAILY 90 tablet 0  . omeprazole (PRILOSEC) 40 MG capsule Take 1 capsule (40 mg total) by mouth 2 (two) times daily. 180 capsule 3  . predniSONE (STERAPRED UNI-PAK 21 TAB) 10 MG (21) TBPK tablet Take 6 tablets on day 1, then 5 tablets on day 2, then 4 tablets on day 3, then 3 tablets on day 4, then 2 tablets on day 5 and 1 tablet on day 6. 21 tablet 0  . sertraline (ZOLOFT) 50 MG tablet TAKE ONE (1) TABLET ONCE DAILY 90 tablet 2  . SYMBICORT 160-4.5 MCG/ACT inhaler INHALE 2 PUFFS TWICE DAILY 30.6 g 1  . azithromycin (ZITHROMAX) 250 MG tablet Take two tablets by mouth daily for 3 days 6 tablet 0  . diclofenac (VOLTAREN) 75 MG EC tablet Take 75 mg by mouth 2 (two) times daily as needed.      No facility-administered medications prior to visit.    Allergies  Allergen Reactions  . Atorvastatin Other (See Comments)    Muscle cramps  . Codeine   . Crestor [Rosuvastatin] Other (See Comments)    Myalgias  . Diphenhydramine Hcl   . Elemental Sulfur     Review of Systems  Constitutional: Positive for fatigue. Negative for activity change, appetite change, chills and fever.  HENT: Negative for congestion, rhinorrhea and sore throat.   Respiratory: Positive for cough and wheezing (intermittent). Negative for shortness of breath.   Cardiovascular: Negative for chest pain and palpitations.  Gastrointestinal: Negative for abdominal pain, constipation, diarrhea, nausea and vomiting.  Genitourinary: Negative for dysuria, flank pain and urgency.  Musculoskeletal: Positive for arthralgias (left lateral rib pain with deep inspiration and cough). Negative for myalgias.  Skin: Negative.   Neurological: Negative for dizziness, weakness, light-headedness and headaches.  Psychiatric/Behavioral: Negative for dysphoric mood. The patient is not nervous/anxious.        Objective:    Physical Exam Vitals reviewed.  HENT:     Head: Normocephalic.     Right Ear: There is impacted cerumen.     Left Ear: Tympanic membrane normal.     Nose: Nose normal.     Mouth/Throat:     Mouth: Mucous membranes are moist.  Cardiovascular:     Rate and Rhythm: Normal rate and regular rhythm.     Pulses: Normal pulses.     Heart sounds: Normal heart sounds.  Pulmonary:     Effort: Pulmonary effort is normal. No respiratory distress.     Breath sounds: Rhonchi (posterior lobes ) present. No wheezing.  Abdominal:     General: Bowel sounds are normal.     Palpations: Abdomen is soft.  Musculoskeletal:     Cervical back: Neck supple.  Skin:    General: Skin is warm and dry.     Capillary Refill: Capillary refill takes less than 2 seconds.  Neurological:     General: No focal deficit present.      Mental Status: She is alert and oriented to person, place, and time.  Psychiatric:        Mood and Affect: Mood normal.        Behavior: Behavior normal.     BP 134/76   Pulse 80   Temp (!) 97.3 F (36.3 C)   Resp 16   Ht 5\' 7"  (1.702 m)   Wt 196 lb (88.9 kg)   BMI 30.70 kg/m  Wt Readings from Last 3 Encounters:  07/09/20 196 lb (88.9 kg)  05/07/20 193 lb (87.5 kg)  11/28/19 184 lb (83.5 kg)    Health Maintenance Due  Topic Date Due  . Hepatitis C Screening  Never done  . HIV Screening  Never done  . TETANUS/TDAP  Never done  . PAP SMEAR-Modifier  05/06/2015  . MAMMOGRAM  08/09/2019    No results found for: TSH Lab Results  Component Value Date   WBC 6.5 05/07/2020   HGB 14.2 05/07/2020   HCT 43.4 05/07/2020   MCV 90 05/07/2020   PLT 331 05/07/2020   Lab Results  Component Value Date   NA 138 05/07/2020   K 5.1 05/07/2020   CO2 24 05/07/2020   GLUCOSE 102 (H) 05/07/2020   BUN 10 05/07/2020   CREATININE 0.69 05/07/2020   BILITOT <0.2 05/07/2020   ALKPHOS 107 05/07/2020   AST 25 05/07/2020   ALT 30 05/07/2020   PROT 7.5 05/07/2020   ALBUMIN 4.8 05/07/2020   CALCIUM 9.8 05/07/2020   Lab Results  Component Value Date   CHOL 208 (H) 05/07/2020   Lab Results  Component Value Date   HDL 68 05/07/2020   Lab Results  Component Value Date   LDLCALC 122 (H) 05/07/2020   Lab Results  Component Value Date   TRIG 101 05/07/2020   Lab Results  Component Value Date   CHOLHDL 3.1 05/07/2020   Lab Results  Component Value Date   HGBA1C 6.2 (H) 05/07/2020       Assessment & Plan:  1. Cough in adult - POC COVID-19    Continue antibiotics, steroids, and inhalers as prescribed Notify office if symptoms fail to improve or worsen Rest and push fluids Chest x-ray in 7-9 weeks if needed Keep next appt as scheduled   Orders Placed This Encounter  Procedures  . POC COVID-19       Follow-up: Has f/u scheduled on 08/06/20  An After Visit  Summary was printed and given to the patient.  Flonnie Hailstone, DNP [facility] [phone]

## 2020-07-12 ENCOUNTER — Encounter: Payer: Self-pay | Admitting: Family Medicine

## 2020-07-19 ENCOUNTER — Encounter: Payer: Self-pay | Admitting: Gastroenterology

## 2020-07-27 ENCOUNTER — Other Ambulatory Visit: Payer: Self-pay | Admitting: Family Medicine

## 2020-08-06 ENCOUNTER — Ambulatory Visit: Payer: BC Managed Care – PPO | Admitting: Nurse Practitioner

## 2020-08-06 ENCOUNTER — Other Ambulatory Visit: Payer: Self-pay

## 2020-08-06 ENCOUNTER — Encounter: Payer: Self-pay | Admitting: Nurse Practitioner

## 2020-08-06 VITALS — BP 110/62 | HR 76 | Temp 97.3°F | Ht 67.0 in | Wt 194.0 lb

## 2020-08-06 DIAGNOSIS — H6121 Impacted cerumen, right ear: Secondary | ICD-10-CM | POA: Diagnosis not present

## 2020-08-06 DIAGNOSIS — J4540 Moderate persistent asthma, uncomplicated: Secondary | ICD-10-CM

## 2020-08-06 DIAGNOSIS — R5383 Other fatigue: Secondary | ICD-10-CM

## 2020-08-06 DIAGNOSIS — F329 Major depressive disorder, single episode, unspecified: Secondary | ICD-10-CM | POA: Diagnosis not present

## 2020-08-06 DIAGNOSIS — E782 Mixed hyperlipidemia: Secondary | ICD-10-CM

## 2020-08-06 DIAGNOSIS — I10 Essential (primary) hypertension: Secondary | ICD-10-CM

## 2020-08-06 DIAGNOSIS — K219 Gastro-esophageal reflux disease without esophagitis: Secondary | ICD-10-CM

## 2020-08-06 DIAGNOSIS — R7301 Impaired fasting glucose: Secondary | ICD-10-CM

## 2020-08-06 DIAGNOSIS — R413 Other amnesia: Secondary | ICD-10-CM

## 2020-08-06 DIAGNOSIS — Z1231 Encounter for screening mammogram for malignant neoplasm of breast: Secondary | ICD-10-CM

## 2020-08-06 NOTE — Patient Instructions (Addendum)
Continue prescribed medications Follow-up in 41-month or sooner if needed We will call you with lab results and mammogram appointment   Preventive Care [id number] Old, Female Preventive care refers to lifestyle choices and visits with your health care provider that can promote health and wellness. This includes:  A yearly physical exam. This is also called an annual wellness visit.  Regular dental and eye exams.  Immunizations.  Screening for certain conditions.  Healthy lifestyle choices, such as: ? Eating a healthy diet. ? Getting regular exercise. ? Not using drugs or products that contain nicotine and tobacco. ? Limiting alcohol use. What can I expect for my preventive care visit? Physical exam Your health care provider will check your:  Height and weight. These may be used to calculate your BMI (body mass index). BMI is a measurement that tells if you are at a healthy weight.  Heart rate and blood pressure.  Body temperature.  Skin for abnormal spots. Counseling Your health care provider may ask you questions about your:  Past medical problems.  Family's medical history.  Alcohol, tobacco, and drug use.  Emotional well-being.  Home life and relationship well-being.  Sexual activity.  Diet, exercise, and sleep habits.  Work and work eStatistician  Access to firearms.  Method of birth control.  Menstrual cycle.  Pregnancy history. What immunizations do I need? Vaccines are usually given at various ages, according to a schedule. Your health care provider will recommend vaccines for you based on your age, medical history, and lifestyle or other factors, such as travel or where you work.   What tests do I need? Blood tests  Lipid and cholesterol levels. These may be checked every 5 years, or more often if you are over 61years old.  Hepatitis C test.  Hepatitis B test. Screening  Lung cancer screening. You may have this screening every year  starting at age 6161if you have a 30-pack-year history of smoking and currently smoke or have quit within the past 15 years.  Colorectal cancer screening. ? All adults should have this screening starting at age 6161and continuing until age 79963 ? Your health care provider may recommend screening at age 6171if you are at increased risk. ? You will have tests every 1-10 years, depending on your results and the type of screening test.  Diabetes screening. ? This is done by checking your blood sugar (glucose) after you have not eaten for a while (fasting). ? You may have this done every 1-3 years.  Mammogram. ? This may be done every 1-2 years. ? Talk with your health care provider about when you should start having regular mammograms. This may depend on whether you have a family history of breast cancer.  BRCA-related cancer screening. This may be done if you have a family history of breast, ovarian, tubal, or peritoneal cancers.  Pelvic exam and Pap test. ? This may be done every 3 years starting at age 61 ? Starting at age 61 this may be done every 5 years if you have a Pap test in combination with an HPV test. Other tests  STD (sexually transmitted disease) testing, if you are at risk.  Bone density scan. This is done to screen for osteoporosis. You may have this scan if you are at high risk for osteoporosis. Talk with your health care provider about your test results, treatment options, and if necessary, the need for more tests. Follow these instructions at home: Eating and drinking  Eat  a diet that includes fresh fruits and vegetables, whole grains, lean protein, and low-fat dairy products.  Take vitamin and mineral supplements as recommended by your health care provider.  Do not drink alcohol if: ? Your health care provider tells you not to drink. ? You are pregnant, may be pregnant, or are planning to become pregnant.  If you drink alcohol: ? Limit how much you have to 0-1  drink a day. ? Be aware of how much alcohol is in your drink. In the [place] one drink equals one 12 oz bottle of beer (355 mL), one 5 oz glass of wine (148 mL), or one 1 oz glass of hard liquor (44 mL).   Lifestyle  Take daily care of your teeth and gums. Brush your teeth every morning and night with fluoride toothpaste. Floss one time each day.  Stay active. Exercise for at least 30 minutes 5 or more days each week.  Do not use any products that contain nicotine or tobacco, such as cigarettes, e-cigarettes, and chewing tobacco. If you need help quitting, ask your health care provider.  Do not use drugs.  If you are sexually active, practice safe sex. Use a condom or other form of protection to prevent STIs (sexually transmitted infections).  If you do not wish to become pregnant, use a form of birth control. If you plan to become pregnant, see your health care provider for a prepregnancy visit.  If told by your health care provider, take low-dose aspirin daily starting at age 35.  Find healthy ways to cope with stress, such as: ? Meditation, yoga, or listening to music. ? Journaling. ? Talking to a trusted person. ? Spending time with friends and family. Safety  Always wear your seat belt while driving or riding in a vehicle.  Do not drive: ? If you have been drinking alcohol. Do not ride with someone who has been drinking. ? When you are tired or distracted. ? While texting.  Wear a helmet and other protective equipment during sports activities.  If you have firearms in your house, make sure you follow all gun safety procedures. What's next?  Visit your health care provider once a year for an annual wellness visit.  Ask your health care provider how often you should have your eyes and teeth checked.  Stay up to date on all vaccines. This information is not intended to replace advice given to you by your health care provider. Make sure you discuss any questions you have  with your health care provider. Document Revised: 02/24/2020 Document Reviewed: 01/31/2018 Elsevier Patient Education  2021 [place].  Cholesterol Content in Foods Cholesterol is a waxy, fat-like substance that helps to carry fat in the blood. The body needs cholesterol in small amounts, but too much cholesterol can cause damage to the arteries and heart. Most people should eat less than 200 milligrams (mg) of cholesterol a day. Foods with cholesterol Cholesterol is found in animal-based foods, such as meat, seafood, and dairy. Generally, low-fat dairy and lean meats have less cholesterol than full-fat dairy and fatty meats. The milligrams of cholesterol per serving (mg per serving) of common cholesterol-containing foods are listed below. Meat and other proteins  Egg - one large whole egg has 186 mg.  Veal shank - 4 oz has 141 mg.  Lean ground [place] (93% lean) - 4 oz has 118 mg.  Fat-trimmed lamb loin - 4 oz has 106 mg.  Lean ground beef (90% lean) - 4 oz has  100 mg.  Lobster - 3.5 oz has 90 mg.  Pork loin chops - 4 oz has 86 mg.  Canned salmon - 3.5 oz has 83 mg.  Fat-trimmed beef top loin - 4 oz has 78 mg.  Frankfurter - 1 frank (3.5 oz) has 77 mg.  Crab - 3.5 oz has 71 mg.  Roasted chicken without skin, white meat - 4 oz has 66 mg.  Light bologna - 2 oz has 45 mg.  Deli-cut [place] - 2 oz has 31 mg.  Canned tuna - 3.5 oz has 31 mg.  Bacon - 1 oz has 29 mg.  Oysters and mussels (raw) - 3.5 oz has 25 mg.  Mackerel - 1 oz has 22 mg.  Trout - 1 oz has 20 mg.  Pork sausage - 1 link (1 oz) has 17 mg.  Salmon - 1 oz has 16 mg.  Tilapia - 1 oz has 14 mg. Dairy  Soft-serve ice cream -  cup (4 oz) has 103 mg.  Whole-milk yogurt - 1 cup (8 oz) has 29 mg.  Cheddar cheese - 1 oz has 28 mg.  American cheese - 1 oz has 28 mg.  Whole milk - 1 cup (8 oz) has 23 mg.  2% milk - 1 cup (8 oz) has 18 mg.  Cream cheese - 1 tablespoon (Tbsp) has 15 mg.  Cottage  cheese -  cup (4 oz) has 14 mg.  Low-fat (1%) milk - 1 cup (8 oz) has 10 mg.  Sour cream - 1 Tbsp has 8.5 mg.  Low-fat yogurt - 1 cup (8 oz) has 8 mg.  Nonfat Greek yogurt - 1 cup (8 oz) has 7 mg.  Half-and-half cream - 1 Tbsp has 5 mg. Fats and oils  Cod liver oil - 1 tablespoon (Tbsp) has 82 mg.  Butter - 1 Tbsp has 15 mg.  Lard - 1 Tbsp has 14 mg.  Bacon grease - 1 Tbsp has 14 mg.  Mayonnaise - 1 Tbsp has 5-10 mg.  Margarine - 1 Tbsp has 3-10 mg. Exact amounts of cholesterol in these foods may vary depending on specific ingredients and brands.   Foods without cholesterol Most plant-based foods do not have cholesterol unless you combine them with a food that has cholesterol. Foods without cholesterol include:  Grains and cereals.  Vegetables.  Fruits.  Vegetable oils, such as olive, canola, and sunflower oil.  Legumes, such as peas, beans, and lentils.  Nuts and seeds.  Egg whites.   Summary  The body needs cholesterol in small amounts, but too much cholesterol can cause damage to the arteries and heart.  Most people should eat less than 200 milligrams (mg) of cholesterol a day. This information is not intended to replace advice given to you by your health care provider. Make sure you discuss any questions you have with your health care provider. Document Revised: 10/13/2019 Document Reviewed: 10/13/2019 Elsevier Patient Education  2021 [place], Adult The ears produce a substance called earwax that helps keep bacteria out of the ear and protects the skin in the ear canal. Occasionally, earwax can build up in the ear and cause discomfort or hearing loss. What are the causes? This condition is caused by a buildup of earwax. Ear canals are self-cleaning. Ear wax is made in the outer part of the ear canal and generally falls out in small amounts over time. When the self-cleaning mechanism is not working, earwax builds up and can cause  decreased hearing and discomfort. Attempting to clean ears with cotton swabs can push the earwax deep into the ear canal and cause decreased hearing and pain. What increases the risk? This condition is more likely to develop in people who:  Clean their ears often with cotton swabs.  Pick at their ears.  Use earplugs or in-ear headphones often, or wear hearing aids. The following factors may also make you more likely to develop this condition:  Being female.  Being of older age.  Naturally producing more earwax.  Having narrow ear canals.  Having earwax that is overly thick or sticky.  Having excess hair in the ear canal.  Having eczema.  Being dehydrated. What are the signs or symptoms? Symptoms of this condition include:  Reduced or muffled hearing.  A feeling of fullness in the ear or feeling that the ear is plugged.  Fluid coming from the ear.  Ear pain or an itchy ear.  Ringing in the ear.  Coughing.  Balance problems.  An obvious piece of earwax that can be seen inside the ear canal. How is this diagnosed? This condition may be diagnosed based on:  Your symptoms.  Your medical history.  An ear exam. During the exam, your health care provider will look into your ear with an instrument called an otoscope. You may have tests, including a hearing test. How is this treated? This condition may be treated by:  Using ear drops to soften the earwax.  Having the earwax removed by a health care provider. The health care provider may: ? Flush the ear with water. ? Use an instrument that has a loop on the end (curette). ? Use a suction device.  Having surgery to remove the wax buildup. This may be done in severe cases. Follow these instructions at home:  Take over-the-counter and prescription medicines only as told by your health care provider.  Do not put any objects, including cotton swabs, into your ear. You can clean the opening of your ear canal with a  washcloth or facial tissue.  Follow instructions from your health care provider about cleaning your ears. Do not overclean your ears.  Drink enough fluid to keep your urine pale yellow. This will help to thin the earwax.  Keep all follow-up visits as told. If earwax builds up in your ears often or if you use hearing aids, consider seeing your health care provider for routine, preventive ear cleanings. Ask your health care provider how often you should schedule your cleanings.  If you have hearing aids, clean them according to instructions from the manufacturer and your health care provider.   Contact a health care provider if:  You have ear pain.  You develop a fever.  You have pus or other fluid coming from your ear.  You have hearing loss.  You have ringing in your ears that does not go away.  You feel like the room is spinning (vertigo).  Your symptoms do not improve with treatment. Get help right away if:  You have bleeding from the affected ear.  You have severe ear pain. Summary  Earwax can build up in the ear and cause discomfort or hearing loss.  The most common symptoms of this condition include reduced or muffled hearing, a feeling of fullness in the ear, or feeling that the ear is plugged.  This condition may be diagnosed based on your symptoms, your medical history, and an ear exam.  This condition may be treated by using ear drops  to soften the earwax or by having the earwax removed by a health care provider.  Do not put any objects, including cotton swabs, into your ear. You can clean the opening of your ear canal with a washcloth or facial tissue. This information is not intended to replace advice given to you by your health care provider. Make sure you discuss any questions you have with your health care provider. Document Revised: 09/09/2019 Document Reviewed: 09/09/2019 Elsevier Patient Education  2021 [place].  Ear Irrigation Ear irrigation is a  procedure to wash dirt and wax out of your ear canal. This procedure is also called lavage. You may need ear irrigation if you are having trouble hearing because of a buildup of earwax. You may also have ear irrigation as part of the treatment for an ear infection. Getting wax and dirt out of your ear canal can help ear drops work better. Tell a health care provider about:  Any allergies you have.  All medicines you are taking, including vitamins, herbs, eye drops, creams, and over-the-counter medicines.  Any problems you or family members have had with anesthetic medicines.  Any blood disorders you have.  Any surgeries you have had. This includes any ear surgeries.  Any medical conditions you have.  Whether you are pregnant or may be pregnant. What are the risks? Generally, this is a safe procedure. However, problems may occur, including:  Infection.  Pain.  Hearing loss.  Fluid and debris being pushed through the eardrum and into the middle ear. This can occur if there are holes in the eardrum.  Ear irrigation failing to work. What happens before the procedure?  You will talk with your provider about the procedure and plan.  You may be given ear drops to put in your ear 15-20 minutes before irrigation. This helps loosen the wax. What happens during the procedure?  A syringe is filled with water or saline solution, which is made of salt and water.  The syringe is gently inserted into the ear canal.  The fluid is used to flush out wax and other debris. The procedure may vary among health care providers and hospitals.   What can I expect after the procedure? After an ear irrigation, follow instructions given to you by your health care provider. Follow these instructions at home: Using ear irrigation kits Ear irrigation kits are available for use at home. Ask your health care provider if this is an option for you. In general, you should:  Use a home irrigation kit only as  told by your health care provider.  Read the package instructions carefully.  Follow the directions for using the syringe.  Use water that is room temperature. Do not do ear irrigation at home if you:  Have diabetes. Diabetes increases the risk of infection.  Have a hole or tear in your eardrum.  Have tubes in your ears.  Have had any ear surgery in the past.  Have been told not to irrigate your ears. Cleaning your ears  Clean the outside of your ear with a soft washcloth daily.  If told by your health care provider, use a few drops of baby oil, mineral oil, glycerin, hydrogen peroxide, or over-the-counter earwax softening drops.  Do not use cotton swabs to clean your ears. These can push wax down into the ear canal.  Do not put anything into your ears to try to remove wax. This includes ear candles.   General instructions  Take over-the-counter and prescription medicines  only as told by your health care provider.  If you were prescribed an antibiotic medicine, use it as told by your health care provider. Do not stop using the antibiotic even if your condition improves.  Keep the ear clean and dry by following the instructions from your health care provider.  Keep all follow-up visits. This is important.  Visit your health care provider at least once a year to have your ears and hearing checked. Contact a health care provider if:  Your hearing is not improving or is getting worse.  You have pain or redness in your ear.  You are dizzy.  You have ringing in your ears.  You have nausea or vomiting.  You have fluid, blood, or pus coming out of your ear. Summary  Ear irrigation is a procedure to wash dirt and wax out of your ear canal. This procedure is also called lavage.  To perform ear irrigation, ear drops may be put in your ear 15-20 minutes before irrigation. Water or saline solution will be used to flush out earwax and other debris.  You may be able to  irrigate your ears at home. Ask your health care provider if this is an option for you. Follow your health care provider's instructions.  Clean your ears with a soft cloth after irrigation. Do not use cotton swabs to clean your ears. These can push wax down into the ear canal. This information is not intended to replace advice given to you by your health care provider. Make sure you discuss any questions you have with your health care provider. Document Revised: 09/09/2019 Document Reviewed: 09/09/2019 Elsevier Patient Education  [place].

## 2020-08-06 NOTE — Progress Notes (Addendum)
Established Patient Office Visit  Subjective:  Patient ID: Brittany Walton, female    DOB: 11/03/1960  Age: 60 y.o. MRN: [id number]  CC: follow-up hypertension, hyperlipidemia, and asthma   HPI Brittany Walton is a 59 year old Caucasian female present for follow-up of hypertension, hyperlipidemia, and asthma. She is due for a screening mammogram. She tells me she has been experiencing increased forgetfulness and fatigue. She recently retired as a Engineer, site. Denies new medications or medical diagnoses since last visit.  Hypertension Brittany Walton has had a history of hypertension for several years. Current treatment includes Benicar 30 mg daily. BP 11/62 today in office. She denies chest pain, dyspnea, dizziness, or headaches. She is adherent to medication regimen and follow-up appointments. She attempts to eat a heart healthy diet. She is not currently exercises regularly. States she plans to begin walking outdoors regularly as weather warms.   Hyperlipidemia Brittany Walton has a history of hyperlipidemia for several years. Current treatment includes Zetia 10 mg daily. Lipids are not currently at goal per labs on 05/07/20 which revealed TC 208, Trig 101, HDL 68, and LDL 122. She plans to begin exercising and eat healthier.   Asthma Brittany Walton has has a life-long history of asthma. She states symptoms are well-controlled currently with Symbicort daily and Albuterol inhalers/nebulizers as needed. She denies recent hospitalizations or exacerbations of asthma.     Past Medical History:  Diagnosis Date  . ADHD   . Asthma   . Biliary dyskinesia   . Depression   . GERD (gastroesophageal reflux disease)   . Hyperlipidemia   . Hypertension   . Impaired fasting glucose   . Obesity     Past Surgical History:  Procedure Laterality Date  . ABDOMINAL HYSTERECTOMY  2006   patrial for uterine prolapse  . HERNIA REPAIR  2009   umbilical, bilateral inguinal    Family History  Problem Relation Age of Onset  .  Heart attack Mother   . Dementia Mother     Social History   Socioeconomic History  . Marital status: Married    Spouse name: Not on file  . Number of children: 2  . Years of education: Not on file  . Highest education level: Not on file  Occupational History  . Occupation: Runner, broadcasting/film/video- [employer]  Tobacco Use  . Smoking status: Never Smoker  . Smokeless tobacco: Never Used  Vaping Use  . Vaping Use: Never used  Substance and Sexual Activity  . Alcohol use: Yes    Comment: Drinks alcohol very infrequently. She typically consumes wine.  . Drug use: Never  . Sexual activity: Not on file  Other Topics Concern  . Not on file  Social History Narrative  . Not on file   Social Determinants of Health   Financial Resource Strain: Not on file  Food Insecurity: Not on file  Transportation Needs: Not on file  Physical Activity: Not on file  Stress: Not on file  Social Connections: Not on file  Intimate Partner Violence: Not on file    Outpatient Medications Prior to Visit  Medication Sig Dispense Refill  . albuterol (PROVENTIL) (2.5 MG/3ML) 0.083% nebulizer solution Take 2.5 mg by nebulization every 6 (six) hours as needed for wheezing or shortness of breath.    Marland Kitchen albuterol (VENTOLIN HFA) 108 (90 Base) MCG/ACT inhaler Inhale 1-2 puffs into the lungs every 6 (six) hours as needed for wheezing or shortness of breath.    . amphetamine-dextroamphetamine (ADDERALL) 20 MG tablet Take 1  tablet (20 mg total) by mouth 2 (two) times daily. 60 tablet 0  . cefdinir (OMNICEF) 300 MG capsule Take 1 capsule (300 mg total) by mouth 2 (two) times daily. 20 capsule 0  . ezetimibe (ZETIA) 10 MG tablet TAKE ONE (1) TABLET ONCE DAILY 90 tablet 0  . ibuprofen (ADVIL) 800 MG tablet TAKE 1 TABLET 3 TIMES DAILY AS NEEDED WITH FOOD 90 tablet 1  . montelukast (SINGULAIR) 10 MG tablet TAKE ONE (1) TABLET AT BEDTIME 90 tablet 3  . olmesartan (BENICAR) 20 MG tablet TAKE ONE (1) TABLET ONCE DAILY 90  tablet 0  . omeprazole (PRILOSEC) 40 MG capsule TAKE ONE CAPSULE BY MOUTH TWICE A DAY 180 capsule 3  . predniSONE (STERAPRED UNI-PAK 21 TAB) 10 MG (21) TBPK tablet Take 6 tablets on day 1, then 5 tablets on day 2, then 4 tablets on day 3, then 3 tablets on day 4, then 2 tablets on day 5 and 1 tablet on day 6. 21 tablet 0  . sertraline (ZOLOFT) 50 MG tablet TAKE ONE (1) TABLET ONCE DAILY 90 tablet 2  . SYMBICORT 160-4.5 MCG/ACT inhaler INHALE 2 PUFFS TWICE DAILY 30.6 g 1   No facility-administered medications prior to visit.    Allergies  Allergen Reactions  . Atorvastatin Other (See Comments)    Muscle cramps  . Codeine   . Crestor [Rosuvastatin] Other (See Comments)    Myalgias  . Diphenhydramine Hcl   . Elemental Sulfur     ROS Review of Systems  Constitutional: Negative for appetite change, fatigue and unexpected weight change.  HENT: Negative for congestion, ear pain, rhinorrhea, sinus pressure, sinus pain and tinnitus.   Eyes: Negative for pain.  Respiratory: Negative for cough and shortness of breath.   Cardiovascular: Negative for chest pain, palpitations and leg swelling.  Gastrointestinal: Negative for abdominal pain, constipation, diarrhea, nausea and vomiting.  Endocrine: Negative for cold intolerance, heat intolerance, polydipsia, polyphagia and polyuria.  Genitourinary: Negative for dysuria, frequency and hematuria.  Musculoskeletal: Negative for arthralgias, back pain, joint swelling and myalgias.  Skin: Negative for rash.  Allergic/Immunologic: Negative for environmental allergies.  Neurological: Negative for dizziness and headaches.  Hematological: Negative for adenopathy.  Psychiatric/Behavioral: Negative for decreased concentration and sleep disturbance. The patient is not nervous/anxious.       Objective:    Physical Exam Vitals reviewed.  Constitutional:      Appearance: Normal appearance. She is obese.  HENT:     Head: Normocephalic.     Right Ear:  Tympanic membrane normal. There is impacted cerumen.     Left Ear: Tympanic membrane normal.     Nose: Nose normal.     Mouth/Throat:     Mouth: Mucous membranes are moist.  Neck:     Vascular: No carotid bruit.  Cardiovascular:     Rate and Rhythm: Normal rate and regular rhythm.     Pulses: Normal pulses.     Heart sounds: Normal heart sounds.  Pulmonary:     Effort: Pulmonary effort is normal.     Breath sounds: Normal breath sounds.  Abdominal:     General: Bowel sounds are normal.     Palpations: Abdomen is soft.     Tenderness: There is no abdominal tenderness. There is no guarding.  Musculoskeletal:        General: No swelling.  Skin:    General: Skin is warm and dry.     Capillary Refill: Capillary refill takes less than 2 seconds.  Neurological:     Mental Status: She is alert and oriented to person, place, and time.  Psychiatric:        Mood and Affect: Mood normal.        Behavior: Behavior normal.     There were no vitals taken for this visit. Wt Readings from Last 3 Encounters:  07/09/20 196 lb (88.9 kg)  05/07/20 193 lb (87.5 kg)  11/28/19 184 lb (83.5 kg)     Health Maintenance Due  Topic Date Due  . Hepatitis C Screening  Never done  . HIV Screening  Never done  . TETANUS/TDAP  Never done  . PAP SMEAR-Modifier  05/06/2015  . MAMMOGRAM  08/09/2019      Lab Results  Component Value Date   WBC 6.5 05/07/2020   HGB 14.2 05/07/2020   HCT 43.4 05/07/2020   MCV 90 05/07/2020   PLT 331 05/07/2020   Lab Results  Component Value Date   NA 138 05/07/2020   K 5.1 05/07/2020   CO2 24 05/07/2020   GLUCOSE 102 (H) 05/07/2020   BUN 10 05/07/2020   CREATININE 0.69 05/07/2020   BILITOT <0.2 05/07/2020   ALKPHOS 107 05/07/2020   AST 25 05/07/2020   ALT 30 05/07/2020   PROT 7.5 05/07/2020   ALBUMIN 4.8 05/07/2020   CALCIUM 9.8 05/07/2020   Lab Results  Component Value Date   CHOL 208 (H) 05/07/2020   Lab Results  Component Value Date   HDL  68 05/07/2020   Lab Results  Component Value Date   LDLCALC 122 (H) 05/07/2020   Lab Results  Component Value Date   TRIG 101 05/07/2020   Lab Results  Component Value Date   CHOLHDL 3.1 05/07/2020   Lab Results  Component Value Date   HGBA1C 6.2 (H) 05/07/2020      Assessment & Plan:   1. Primary hypertension - CBC With Diff/Platelet - Comprehensive metabolic panel  2. Mixed hyperlipidemia - Lipid panel  3. Gastroesophageal reflux disease without esophagitis  4. Major depressive disorder with single episode, remission status unspecified  5. Impaired fasting glucose - Hemoglobin A1c  6. Moderate persistent asthma, unspecified whether complicated  7. Encounter for screening mammogram for malignant neoplasm of breast - MM DIGITAL SCREENING BILATERAL  8. Memory loss - Methylmalonic Acid, Serum - B12 and Folate Panel - VITAMIN D 25 Hydroxy (Vit-D Deficiency, Fractures)  9. Other fatigue - TSH  10. Impacted cerumen of right ear - EAR CERUMEN REMOVAL   Continue prescribed medications Follow-up in 11-months or sooner if needed We will call you with lab results and mammogram appointment     Follow-up: 39-months    Janie Morning, NP

## 2020-08-07 LAB — LIPID PANEL
Chol/HDL Ratio: 3.2 ratio (ref 0.0–4.4)
Cholesterol, Total: 193 mg/dL (ref 100–199)
HDL: 61 mg/dL (ref >39)
LDL Chol Calc (NIH): 111 mg/dL — ABNORMAL HIGH (ref 0–99)
Triglycerides: 120 mg/dL (ref 0–149)
VLDL Cholesterol Cal: 21 mg/dL (ref 5–40)

## 2020-08-07 LAB — CBC WITH DIFF/PLATELET
Basophils Absolute: 0 10*3/uL (ref 0.0–0.2)
Basos: 1 %
EOS (ABSOLUTE): 0.4 10*3/uL (ref 0.0–0.4)
Eos: 7 %
Hematocrit: 40.9 % (ref 34.0–46.6)
Hemoglobin: 13.6 g/dL (ref 11.1–15.9)
Immature Grans (Abs): 0 10*3/uL (ref 0.0–0.1)
Immature Granulocytes: 0 %
Lymphocytes Absolute: 2 10*3/uL (ref 0.7–3.1)
Lymphs: 37 %
MCH: 29.2 pg (ref 26.6–33.0)
MCHC: 33.3 g/dL (ref 31.5–35.7)
MCV: 88 fL (ref 79–97)
Monocytes Absolute: 0.4 10*3/uL (ref 0.1–0.9)
Monocytes: 7 %
Neutrophils Absolute: 2.5 10*3/uL (ref 1.4–7.0)
Neutrophils: 48 %
Platelets: 339 10*3/uL (ref 150–450)
RBC: 4.65 x10E6/uL (ref 3.77–5.28)
RDW: 13.3 % (ref 11.7–15.4)
WBC: 5.2 10*3/uL (ref 3.4–10.8)

## 2020-08-07 LAB — COMPREHENSIVE METABOLIC PANEL
ALT: 19 IU/L (ref 0–32)
AST: 16 IU/L (ref 0–40)
Albumin/Globulin Ratio: 1.7 (ref 1.2–2.2)
Albumin: 4.5 g/dL (ref 3.8–4.9)
Alkaline Phosphatase: 93 IU/L (ref 44–121)
BUN/Creatinine Ratio: 16 (ref 12–28)
BUN: 11 mg/dL (ref 8–27)
Bilirubin Total: 0.3 mg/dL (ref 0.0–1.2)
CO2: 22 mmol/L (ref 20–29)
Calcium: 9.2 mg/dL (ref 8.7–10.3)
Chloride: 99 mmol/L (ref 96–106)
Creatinine, Ser: 0.69 mg/dL (ref 0.57–1.00)
Globulin, Total: 2.6 g/dL (ref 1.5–4.5)
Glucose: 112 mg/dL — ABNORMAL HIGH (ref 65–99)
Potassium: 4.2 mmol/L (ref 3.5–5.2)
Sodium: 137 mmol/L (ref 134–144)
Total Protein: 7.1 g/dL (ref 6.0–8.5)
eGFR: 99 mL/min/{1.73_m2} (ref >59)

## 2020-08-07 LAB — TSH: TSH: 2.31 u[IU]/mL (ref 0.450–4.500)

## 2020-08-07 LAB — CARDIOVASCULAR RISK ASSESSMENT

## 2020-08-07 LAB — HEMOGLOBIN A1C
Est. average glucose Bld gHb Est-mCnc: 128 mg/dL
Hgb A1c MFr Bld: 6.1 % — ABNORMAL HIGH (ref 4.8–5.6)

## 2020-08-08 ENCOUNTER — Other Ambulatory Visit: Payer: Self-pay | Admitting: Nurse Practitioner

## 2020-08-08 DIAGNOSIS — F900 Attention-deficit hyperactivity disorder, predominantly inattentive type: Secondary | ICD-10-CM

## 2020-08-08 MED ORDER — AMPHETAMINE-DEXTROAMPHETAMINE 20 MG PO TABS: 20.0000 mg | ORAL_TABLET | Freq: Two times a day (BID) | ORAL | 0 refills | Status: DC

## 2020-08-10 ENCOUNTER — Other Ambulatory Visit: Payer: Self-pay

## 2020-08-10 DIAGNOSIS — F900 Attention-deficit hyperactivity disorder, predominantly inattentive type: Secondary | ICD-10-CM

## 2020-08-10 NOTE — Telephone Encounter (Signed)
Husband calling stating [facility] drug was out of stock. Please send to different pharmacy. Medication pended correctly with correct pharm.   Lorita Officer, [place] 08/10/20 5:10 PM

## 2020-08-11 MED ORDER — AMPHETAMINE-DEXTROAMPHETAMINE 20 MG PO TABS: 20.0000 mg | ORAL_TABLET | Freq: Two times a day (BID) | ORAL | 0 refills | Status: DC

## 2020-08-12 LAB — METHYLMALONIC ACID, SERUM: Methylmalonic Acid: 196 nmol/L (ref 0–378)

## 2020-08-12 LAB — B12 AND FOLATE PANEL
Folate: >20 ng/mL (ref >3.0)
Vitamin B-12: 837 pg/mL (ref 232–1245)

## 2020-08-12 LAB — VITAMIN D 25 HYDROXY (VIT D DEFICIENCY, FRACTURES): Vit D, 25-Hydroxy: 37.4 ng/mL (ref 30.0–100.0)

## 2020-08-30 ENCOUNTER — Other Ambulatory Visit: Payer: Self-pay

## 2020-08-30 ENCOUNTER — Ambulatory Visit
Admission: RE | Admit: 2020-08-30 | Discharge: 2020-08-30 | Disposition: A | Discharge status: Home / Self Care | Payer: BC Managed Care – PPO | Source: Ambulatory Visit | Attending: Family Medicine | Admitting: Family Medicine

## 2020-09-21 ENCOUNTER — Other Ambulatory Visit: Payer: Self-pay | Admitting: Family Medicine

## 2020-09-22 ENCOUNTER — Other Ambulatory Visit: Payer: Self-pay

## 2020-09-22 DIAGNOSIS — F900 Attention-deficit hyperactivity disorder, predominantly inattentive type: Secondary | ICD-10-CM

## 2020-09-23 MED ORDER — AMPHETAMINE-DEXTROAMPHETAMINE 20 MG PO TABS: 20.0000 mg | ORAL_TABLET | Freq: Two times a day (BID) | ORAL | 0 refills | Status: DC

## 2020-09-29 ENCOUNTER — Other Ambulatory Visit: Payer: Self-pay | Admitting: Family Medicine

## 2020-10-28 ENCOUNTER — Other Ambulatory Visit: Payer: Self-pay

## 2020-10-28 DIAGNOSIS — F900 Attention-deficit hyperactivity disorder, predominantly inattentive type: Secondary | ICD-10-CM

## 2020-10-28 MED ORDER — AMPHETAMINE-DEXTROAMPHETAMINE 20 MG PO TABS
20.0000 mg | ORAL_TABLET | Freq: Two times a day (BID) | ORAL | 0 refills | Status: DC
Start: 2020-10-28 — End: 2020-12-07

## 2020-12-07 ENCOUNTER — Other Ambulatory Visit: Payer: Self-pay

## 2020-12-07 DIAGNOSIS — F900 Attention-deficit hyperactivity disorder, predominantly inattentive type: Secondary | ICD-10-CM

## 2020-12-08 MED ORDER — AMPHETAMINE-DEXTROAMPHETAMINE 20 MG PO TABS: 20.0000 mg | ORAL_TABLET | Freq: Two times a day (BID) | ORAL | 0 refills | Status: DC

## 2020-12-13 ENCOUNTER — Other Ambulatory Visit: Payer: Self-pay

## 2020-12-13 DIAGNOSIS — F900 Attention-deficit hyperactivity disorder, predominantly inattentive type: Secondary | ICD-10-CM

## 2020-12-13 MED ORDER — AMPHETAMINE-DEXTROAMPHETAMINE 20 MG PO TABS: 20.0000 mg | ORAL_TABLET | Freq: Two times a day (BID) | ORAL | 0 refills | Status: DC

## 2020-12-13 NOTE — Telephone Encounter (Signed)
Medication was sent to [facility] drug. Pharmacy does not have, needs change. Pended correct pharmacy.   Terrill Mohr 12/13/20 4:29 PM

## 2020-12-15 ENCOUNTER — Other Ambulatory Visit: Payer: Self-pay | Admitting: Nurse Practitioner

## 2020-12-15 ENCOUNTER — Other Ambulatory Visit: Payer: Self-pay | Admitting: Family Medicine

## 2021-01-12 ENCOUNTER — Other Ambulatory Visit: Payer: Self-pay | Admitting: Family Medicine

## 2021-02-07 NOTE — Progress Notes (Signed)
Established Patient Office Visit  Subjective:  Patient ID: Brittany Walton, female    DOB: 1960/08/21  Age: 60 y.o. MRN: [id number]  CC:  Hypertension Hyperlipidemia prediabetes  HPI Brittany Walton presents for follow-up hypertension, hyperlipidemia, pre-diabetes, and asthma. She has a past medical history of chronic allergic rhinitis.states she has been experiencing left ear discomfort, fullness, and intermittent popping. Takes Singulair 10 mg nightly.   Hypertension, follow-up:  She was last seen for hypertension 6 months ago.  BP at that visit was 110/62. Current treatment includes Benicar 20 mg daily.  She reports good compliance with treatment. She is not having side effects.  She is following a Regular diet. She is exercising. She does not smoke.  Use of agents associated with hypertension: none.   Outside blood pressures are . Symptoms: No chest pain No chest pressure  No palpitations No syncope  No dyspnea No orthopnea  No paroxysmal nocturnal dyspnea No lower extremity edema   Pertinent labs: Lab Results  Component Value Date   CHOL 193 08/06/2020   HDL 61 08/06/2020   LDLCALC 111 (H) 08/06/2020   TRIG 120 08/06/2020   CHOLHDL 3.2 08/06/2020   Lab Results  Component Value Date   NA 137 08/06/2020   K 4.2 08/06/2020   CREATININE 0.69 08/06/2020   GFRNONAA 95 05/07/2020   GFRAA 109 05/07/2020   GLUCOSE 112 (H) 08/06/2020     The 10-year ASCVD risk score Mikey Bussing DC Jr., et al., 2013) is: 3%    Lipid/Cholesterol, Follow-up:  Last lipid panel Other pertinent labs  Lab Results  Component Value Date   CHOL 193 08/06/2020   HDL 61 08/06/2020   LDLCALC 111 (H) 08/06/2020   TRIG 120 08/06/2020   CHOLHDL 3.2 08/06/2020   Lab Results  Component Value Date   ALT 19 08/06/2020   AST 16 08/06/2020   PLT 339 08/06/2020   TSH 2.310 08/06/2020     She was last seen for this 6 months ago.  Current treatment includs Zetia 10 mg daily.  She reports good  compliance with treatment. She is not having side effects.  Symptoms: No chest pain No chest pressure/discomfort  No dyspnea No lower extremity edema  No numbness or tingling of extremity No orthopnea  No palpitations No paroxysmal nocturnal dyspnea  No speech difficulty No syncope   Current diet: well balanced Current exercise: walking The 10-year ASCVD risk score Mikey Bussing DC Jr., et al., 2013) is: 3%   Prediabetes, Follow-up:  Lab Results  Component Value Date   HGBA1C 6.1 (H) 08/06/2020   HGBA1C 6.2 (H) 05/07/2020   GLUCOSE 112 (H) 08/06/2020   GLUCOSE 102 (H) 05/07/2020    Last seen for for this 6 months ago.  Management since that visit includes diet and physical activity. Current symptoms include none and have been stable.  Prior visit with dietician: no Current diet: well balanced Current exercise: walking  Pertinent Labs:    Component Value Date/Time   CHOL 193 08/06/2020 1041   TRIG 120 08/06/2020 1041   CHOLHDL 3.2 08/06/2020 1041   CREATININE 0.69 08/06/2020 1041    Wt Readings from Last 3 Encounters:  08/06/20 194 lb (88 kg)  07/09/20 196 lb (88.9 kg)  05/07/20 193 lb (87.5 kg)    Asthma, Follow-up Brittany Walton has has a life-long history of asthma. She  states symptoms are not at goal. Treatment is currently  Symbicort daily inhaler and Proventil MDI  inhalers/nebulizers as needed. She  states she often forgets to use Symbicort daily. Uses Albuterol rescue inhaler when she experiences chest tightness, wheezing, and dyspnea, approximately once weekly. She  denies recent hospitalizations or exacerbations of asthma.    Depression, Follow-up:  She  was last seen for this 6 months ago. Changes made at last visit include Zoloft 50 mg .   She reports good compliance with treatment. She is not having side effects.   She reports good tolerance of treatment. Current symptoms include: fatigue She feels she is Unchanged since last visit.  Depression screen  [facility] 2/9 08/06/2020 05/07/2020  Decreased Interest 0 0  Down, Depressed, Hopeless 0 0  PHQ - 2 Score 0 0  Altered sleeping 1 0  Tired, decreased energy 1 3  Change in appetite 0 0  Feeling bad or failure about yourself  0 0  Trouble concentrating 0 1  Moving slowly or fidgety/restless 0 0  Suicidal thoughts 0 0  PHQ-9 Score 2 4  Difficult doing work/chores Not difficult at all Not difficult at all     GERD, Follow up:  The patient was last seen for GERD 6 months ago. Current treatment includes Prilosec 40 mg daily.  She reports excellent compliance with treatment. She is not having side effects. She is NOT experiencing belching and eructation  Past Medical History:  Diagnosis Date   ADHD    Asthma    Biliary dyskinesia    Depression    GERD (gastroesophageal reflux disease)    Hyperlipidemia    Hypertension    Impaired fasting glucose    Obesity     Past Surgical History:  Procedure Laterality Date   ABDOMINAL HYSTERECTOMY  2006   patrial for uterine prolapse   HERNIA REPAIR  0160   umbilical, bilateral inguinal    Family History  Problem Relation Age of Onset   Heart attack Mother    Dementia Mother     Social History   Socioeconomic History   Marital status: Married    Spouse name: Not on file   Number of children: 2   Years of education: Not on file   Highest education level: Not on file  Occupational History   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 She typically consumes wine.   Drug use: Never   Sexual activity: Not on file  Other Topics Concern   Not on file  Social History Narrative   Not on file   Social Determinants of Health   Financial Resource Strain: Not on file  Food Insecurity: Not on file  Transportation Needs: Not on file   Physical Activity: Not on file  Stress: Not on file  Social Connections: Not on file  Intimate Partner Violence: Not on file    Outpatient Medications Prior to Visit  Medication Sig Dispense Refill   albuterol (PROVENTIL) (2.5 MG/3ML) 0.083% nebulizer solution Take 2.5 mg by nebulization every 6 (six) hours as needed for wheezing or shortness of breath.     albuterol (VENTOLIN HFA) 108 (90 Base) MCG/ACT inhaler Inhale 1-2 puffs into the lungs every 6 (six) hours as needed for wheezing or shortness of breath.     amphetamine-dextroamphetamine (ADDERALL) 20 MG tablet Take 1 tablet (20 mg total) by mouth 2 (two) times daily. 60 tablet 0  ezetimibe (ZETIA) 10 MG tablet TAKE ONE (1) TABLET ONCE DAILY 90 tablet 0   ibuprofen (ADVIL) 800 MG tablet TAKE 1 TABLET 3 TIMES DAILY AS NEEDED WITH FOOD 90 tablet 1   montelukast (SINGULAIR) 10 MG tablet TAKE ONE (1) TABLET AT BEDTIME 90 tablet 3   olmesartan (BENICAR) 20 MG tablet TAKE ONE (1) TABLET ONCE DAILY 90 tablet 0   omeprazole (PRILOSEC) 40 MG capsule TAKE ONE CAPSULE BY MOUTH TWICE A DAY 180 capsule 3   sertraline (ZOLOFT) 50 MG tablet TAKE ONE (1) TABLET ONCE DAILY 90 tablet 2   SYMBICORT 160-4.5 MCG/ACT inhaler INHALE 2 PUFFS TWICE DAILY 30.6 g 1   No facility-administered medications prior to visit.    Allergies  Allergen Reactions   Atorvastatin Other (See Comments)    Muscle cramps   Codeine    Crestor [Rosuvastatin] Other (See Comments)    Myalgias   Diphenhydramine Hcl    Elemental Sulfur     ROS Review of Systems  Constitutional:  Positive for diaphoresis (night) and unexpected weight change (weight gain). Negative for appetite change and fatigue.  HENT:  Positive for ear pain (left ear pressure, popping). Negative for congestion and tinnitus.   Eyes:  Negative for pain.  Respiratory:  Positive for cough, chest tightness and wheezing (intermittent, controlled with inhaler). Negative for shortness of breath.    Cardiovascular:  Negative for chest pain, palpitations and leg swelling.  Gastrointestinal:  Negative for abdominal pain, constipation, diarrhea, nausea and vomiting.  Endocrine: Negative.   Genitourinary:  Negative for dysuria, frequency and hematuria.  Musculoskeletal:  Positive for arthralgias. Negative for back pain, joint swelling and myalgias.  Skin:  Negative for rash.  Allergic/Immunologic: Positive for environmental allergies.  Neurological:  Negative for dizziness and headaches.  Hematological: Negative.   Psychiatric/Behavioral:  Negative for sleep disturbance. The patient is not nervous/anxious.      Objective:    Physical Exam Vitals reviewed.  Constitutional:      Appearance: Normal appearance.  HENT:     Head: Normocephalic.     Right Ear: Tympanic membrane normal.     Left Ear: There is impacted cerumen.     Nose: Nose normal.     Mouth/Throat:     Mouth: Mucous membranes are moist.  Neck:     Vascular: No carotid bruit.  Cardiovascular:     Rate and Rhythm: Normal rate and regular rhythm.     Pulses: Normal pulses.     Heart sounds: Normal heart sounds.  Pulmonary:     Effort: Pulmonary effort is normal.     Breath sounds: Normal breath sounds.  Abdominal:     General: Bowel sounds are normal.     Palpations: Abdomen is soft.     Tenderness: There is no abdominal tenderness. There is no guarding.  Musculoskeletal:        General: No swelling.     Cervical back: Neck supple.  Skin:    General: Skin is warm and dry.     Capillary Refill: Capillary refill takes less than 2 seconds.  Neurological:     General: No focal deficit present.     Mental Status: She is alert and oriented to person, place, and time.  Psychiatric:        Mood and Affect: Mood normal.        Behavior: Behavior normal.   BP 124/84   Pulse 85   Temp (!) 97 F (36.1 C)   Ht 5'  7" (1.702 m)   Wt 194 lb (88 kg)   SpO2 95%   BMI 30.38 kg/m   Wt Readings from Last 3  Encounters:  08/06/20 194 lb (88 kg)  07/09/20 196 lb (88.9 kg)  05/07/20 193 lb (87.5 kg)     Health Maintenance Due  Topic Date Due   HIV Screening  Never done   Hepatitis C Screening  Never done   TETANUS/TDAP  Never done   Zoster Vaccines- Shingrix (1 of 2) Never done   PAP SMEAR-Modifier  05/06/2015   COVID-19 Vaccine (4 - Booster for Pfizer series) 10/08/2020   INFLUENZA VACCINE  01/03/2021    Lab Results  Component Value Date   TSH 2.310 08/06/2020   Lab Results  Component Value Date   WBC 5.2 08/06/2020   HGB 13.6 08/06/2020   HCT 40.9 08/06/2020   MCV 88 08/06/2020   PLT 339 08/06/2020   Lab Results  Component Value Date   NA 137 08/06/2020   K 4.2 08/06/2020   CO2 22 08/06/2020   GLUCOSE 112 (H) 08/06/2020   BUN 11 08/06/2020   CREATININE 0.69 08/06/2020   BILITOT 0.3 08/06/2020   ALKPHOS 93 08/06/2020   AST 16 08/06/2020   ALT 19 08/06/2020   PROT 7.1 08/06/2020   ALBUMIN 4.5 08/06/2020   CALCIUM 9.2 08/06/2020   EGFR 99 08/06/2020   Lab Results  Component Value Date   CHOL 193 08/06/2020   Lab Results  Component Value Date   HDL 61 08/06/2020   Lab Results  Component Value Date   LDLCALC 111 (H) 08/06/2020   Lab Results  Component Value Date   TRIG 120 08/06/2020   Lab Results  Component Value Date   CHOLHDL 3.2 08/06/2020   Lab Results  Component Value Date   HGBA1C 6.1 (H) 08/06/2020      Assessment & Plan:   1. Primary hypertension-well controlled - CBC with Differential/Platelet - Comprehensive metabolic panel  2. Mixed hyperlipidemia-well controlled - Comprehensive metabolic panel - Lipid panel -Continue Zetia 10 mg daily  3. Gastroesophageal reflux disease without esophagitis-well controlled -continue Prilosec 40 mg daily -avoid foods that trigger GERD   4. Moderate persistent asthma-not at goal - CBC with Differential/Platelet - Comprehensive metabolic panel - Fluticasone-Umeclidin-Vilant (TRELEGY ELLIPTA)  100-62.5-25 MCG/INH AEPB; Inhale 1 puff into the lungs daily.  Dispense: 2 each; Refill: 0 - Fluticasone-Umeclidin-Vilant (TRELEGY ELLIPTA) 100-62.5-25 MCG/INH AEPB; Inhale 1 puff into the lungs daily.  Dispense: 1 each; Refill: 11 -continue Singulair 10 mg QHS  5. Major depressive disorder with single episode, remission status unspecified-well controlled -Continue Zoloft 50 mg daily -PHQ-9 score 2 in office today  6. Prediabetes-well controlled - Hemoglobin A1c -Continue heart healthy diet  -Continue physical activity  7. Personal history of COVID-19  8. Encounter for immunization - Flu Vaccine MDCK QUAD PF  9. Chronic allergic rhinitis - fluticasone (FLONASE) 50 MCG/ACT nasal spray; Place 2 sprays into both nostrils daily.  Dispense: 16 g; Refill: 6  -continue Singulair 10 mg QS  Begin Flonase nasal spray for left ear fullness and chronic allergic rhinitis Stop Symbicort Begin Trelegy inhaler daily, rinse mouth afterwards We will call you with lab results Flu shot given in office today Follow-up in 1-month  Follow-up:  348-month Signed, Brittany Walton

## 2021-02-08 ENCOUNTER — Other Ambulatory Visit: Payer: Self-pay

## 2021-02-08 ENCOUNTER — Encounter: Payer: Self-pay | Admitting: Nurse Practitioner

## 2021-02-08 ENCOUNTER — Encounter: Payer: Self-pay | Admitting: Family Medicine

## 2021-02-08 ENCOUNTER — Ambulatory Visit: Payer: BC Managed Care – PPO | Admitting: Nurse Practitioner

## 2021-02-08 VITALS — BP 124/84 | HR 85 | Temp 97.0°F | Ht 67.0 in | Wt 194.0 lb

## 2021-02-08 DIAGNOSIS — Z23 Encounter for immunization: Secondary | ICD-10-CM | POA: Diagnosis not present

## 2021-02-08 DIAGNOSIS — E782 Mixed hyperlipidemia: Secondary | ICD-10-CM | POA: Diagnosis not present

## 2021-02-08 DIAGNOSIS — I10 Essential (primary) hypertension: Secondary | ICD-10-CM

## 2021-02-08 DIAGNOSIS — F329 Major depressive disorder, single episode, unspecified: Secondary | ICD-10-CM

## 2021-02-08 DIAGNOSIS — Z8616 Personal history of COVID-19: Secondary | ICD-10-CM

## 2021-02-08 DIAGNOSIS — J309 Allergic rhinitis, unspecified: Secondary | ICD-10-CM

## 2021-02-08 DIAGNOSIS — J4540 Moderate persistent asthma, uncomplicated: Secondary | ICD-10-CM | POA: Diagnosis not present

## 2021-02-08 DIAGNOSIS — K219 Gastro-esophageal reflux disease without esophagitis: Secondary | ICD-10-CM | POA: Diagnosis not present

## 2021-02-08 DIAGNOSIS — R7303 Prediabetes: Secondary | ICD-10-CM

## 2021-02-08 MED ORDER — FLUTICASONE PROPIONATE 50 MCG/ACT NA SUSP: 2.0000 | Freq: Every day | NASAL | 6 refills | Status: DC

## 2021-02-08 MED ORDER — TRELEGY ELLIPTA 100-62.5-25 MCG/INH IN AEPB: 1.0000 | INHALATION_SPRAY | Freq: Every day | RESPIRATORY_TRACT | 11 refills | Status: DC

## 2021-02-08 MED ORDER — TRELEGY ELLIPTA 100-62.5-25 MCG/INH IN AEPB: 1.0000 | INHALATION_SPRAY | Freq: Every day | RESPIRATORY_TRACT | 0 refills | Status: DC

## 2021-02-08 NOTE — Patient Instructions (Addendum)
Begin Flonase nasal spray for left ear fullness and chronic allergic rhinitis Stop Symbicort Begin Trelegy inhaler daily, rinse mouth afterwards We will call you with lab results Flu shot given in office today Follow-up in 15-months  Asthma and Physical Activity Physical activity is an important part of a healthy lifestyle. If you have asthma, it is important to exercise because physical activity can help you to: Control your asthma. Maintain your weight or lose weight. Increase your energy. Decrease stress and anxiety. Lower your risk of getting sick. Improve your heart health. However, asthma symptoms can flare up when you are physically active or exercising. You can learn how to control your asthma and prevent symptoms during exercise. This will help you remain physically active. How can asthma affect my ability to be physically active? When you have asthma, physical activity can cause you to have symptoms such as: Wheezing. This may sound like whistling while breathing. A feeling of tightness in the chest, or chest pain. A sore throat. Coughing. Shortness of breath. Tiredness (fatigue) with minimal activity. Increased sputum production. What actions can I take to prevent asthma problems during physical activity? Start by sticking to your daily asthma medicine regimen. Keeping your asthma under control will allow you to enjoy exercise and sports participation. Asthma action plan Follow the asthma action plan set by your health care provider. Your personal asthma plan may include: Taking your daily controller (maintenance) asthma medicines as told by your health care provider. Using your rescue inhaler before exercise as told by your health care provider. Avoiding your asthma triggers, except physical activity. Triggers may include cold air, dust, pollen, pet dander, and air pollution. Being aware of worsening symptoms. Tracking your asthma control. Using a peak flow  meter. Knowing when to seek emergency care.  Proper breathing During exercise, follow these tips for proper breathing: Breathe in before starting the exercise and breathe out during the part of the exercise that takes the most effort. Take slow breaths. Pace yourself. Do not try to go too fast. While breathing out, purse your lips. Before beginning any exercise program or new activity, talk with your health care provider. Pulmonary rehabilitation Ask your health care provider about signing up for a pulmonary rehabilitation program. Benefits of this type of program include: Education on lung diseases. Classes that teach you how to exercise and be more active while decreasing your shortness of breath. A group setting that allows you to talk with others who have asthma. General information Exercise indoors when the air is dry or during allergy season. Try to breathe in warm, moist air by wearing a scarf loosely over your nose and mouth or breathing only through your nose. Spend a few minutes warming up before you exercise or begin an activity or workout. Cool down after exercise. What should I do if my asthma symptoms get worse? Contact your health care provider if your asthma symptoms are getting worse. Your asthma is getting worse if: You have symptoms more often. Your symptoms are more severe. Your symptoms get worse at night and make you lose sleep. Your peak flow number is lower than your personal best or changes from day to day. Your asthma medicines do not work as well as they used to. You use your rescue inhaler more often. If you use your rescue inhaler more than 2 days a week, your asthma is not well controlled. You go to the emergency room or see your health care provider because of an asthma attack. Where  can I get more information? Ask your health care provider about asthma support groups in your area. American Lung Association: lung.org National Heart, Lung, and Blood  Institute: [place]DryCleaner.gl Centers for Disease Control and Prevention: TonerPromos.no Contact a health care provider if: You have trouble walking and talking because you are out of breath. Get help right away if: Your lips or fingernails are blue. You are not able to breathe or catch your breath. These symptoms may represent a serious problem that is an emergency. Do not wait to see if the symptoms will go away. Get medical help right away. Call your local emergency services (911 in the [place] Do not drive yourself to the hospital. Summary Physical activity is an important part of a healthy lifestyle. However, if you have asthma, your symptoms can flare up during exercise or physical activity. You can prevent problems during physical activity by following your asthma action plan, doing proper breathing, and enrolling in a pulmonary rehabilitation program. Talk with your health care provider before starting any exercise program or new activity. This information is not intended to replace advice given to you by your health care provider. Make sure you discuss any questions you have with your health care provider. Document Revised: 07/12/2020 Document Reviewed: 07/12/2020 Elsevier Patient Education  2022 [employer].

## 2021-02-09 LAB — COMPREHENSIVE METABOLIC PANEL
ALT: 19 IU/L (ref 0–32)
AST: 18 IU/L (ref 0–40)
Albumin/Globulin Ratio: 1.6 (ref 1.2–2.2)
Albumin: 4.7 g/dL (ref 3.8–4.9)
Alkaline Phosphatase: 99 IU/L (ref 44–121)
BUN/Creatinine Ratio: 14 (ref 12–28)
BUN: 10 mg/dL (ref 8–27)
Bilirubin Total: <0.2 mg/dL (ref 0.0–1.2)
CO2: 24 mmol/L (ref 20–29)
Calcium: 9.8 mg/dL (ref 8.7–10.3)
Chloride: 102 mmol/L (ref 96–106)
Creatinine, Ser: 0.71 mg/dL (ref 0.57–1.00)
Globulin, Total: 2.9 g/dL (ref 1.5–4.5)
Glucose: 117 mg/dL — ABNORMAL HIGH (ref 65–99)
Potassium: 5.2 mmol/L (ref 3.5–5.2)
Sodium: 141 mmol/L (ref 134–144)
Total Protein: 7.6 g/dL (ref 6.0–8.5)
eGFR: 97 mL/min/{1.73_m2} (ref >59)

## 2021-02-09 LAB — CBC WITH DIFFERENTIAL/PLATELET
Basophils Absolute: 0.1 10*3/uL (ref 0.0–0.2)
Basos: 1 %
EOS (ABSOLUTE): 0.5 10*3/uL — ABNORMAL HIGH (ref 0.0–0.4)
Eos: 7 %
Hematocrit: 42.8 % (ref 34.0–46.6)
Hemoglobin: 14.1 g/dL (ref 11.1–15.9)
Immature Grans (Abs): 0 10*3/uL (ref 0.0–0.1)
Immature Granulocytes: 0 %
Lymphocytes Absolute: 2.6 10*3/uL (ref 0.7–3.1)
Lymphs: 39 %
MCH: 28.9 pg (ref 26.6–33.0)
MCHC: 32.9 g/dL (ref 31.5–35.7)
MCV: 88 fL (ref 79–97)
Monocytes Absolute: 0.5 10*3/uL (ref 0.1–0.9)
Monocytes: 7 %
Neutrophils Absolute: 3 10*3/uL (ref 1.4–7.0)
Neutrophils: 46 %
Platelets: 345 10*3/uL (ref 150–450)
RBC: 4.88 x10E6/uL (ref 3.77–5.28)
RDW: 14.5 % (ref 11.7–15.4)
WBC: 6.7 10*3/uL (ref 3.4–10.8)

## 2021-02-09 LAB — LIPID PANEL
Chol/HDL Ratio: 3.4 ratio (ref 0.0–4.4)
Cholesterol, Total: 234 mg/dL — ABNORMAL HIGH (ref 100–199)
HDL: 68 mg/dL (ref >39)
LDL Chol Calc (NIH): 139 mg/dL — ABNORMAL HIGH (ref 0–99)
Triglycerides: 153 mg/dL — ABNORMAL HIGH (ref 0–149)
VLDL Cholesterol Cal: 27 mg/dL (ref 5–40)

## 2021-02-09 LAB — HEMOGLOBIN A1C
Est. average glucose Bld gHb Est-mCnc: 134 mg/dL
Hgb A1c MFr Bld: 6.3 % — ABNORMAL HIGH (ref 4.8–5.6)

## 2021-02-09 LAB — CARDIOVASCULAR RISK ASSESSMENT

## 2021-03-10 ENCOUNTER — Other Ambulatory Visit: Payer: Self-pay

## 2021-03-10 DIAGNOSIS — F900 Attention-deficit hyperactivity disorder, predominantly inattentive type: Secondary | ICD-10-CM

## 2021-03-12 MED ORDER — AMPHETAMINE-DEXTROAMPHETAMINE 20 MG PO TABS: 20.0000 mg | ORAL_TABLET | Freq: Two times a day (BID) | ORAL | 0 refills | Status: DC

## 2021-03-15 ENCOUNTER — Other Ambulatory Visit: Payer: Self-pay

## 2021-03-15 DIAGNOSIS — F900 Attention-deficit hyperactivity disorder, predominantly inattentive type: Secondary | ICD-10-CM

## 2021-03-15 MED ORDER — AMPHETAMINE-DEXTROAMPHETAMINE 20 MG PO TABS: 20.0000 mg | ORAL_TABLET | Freq: Two times a day (BID) | ORAL | 0 refills | Status: DC

## 2021-03-21 ENCOUNTER — Other Ambulatory Visit: Payer: Self-pay | Admitting: Legal Medicine

## 2021-03-29 ENCOUNTER — Other Ambulatory Visit: Payer: Self-pay | Admitting: Family Medicine

## 2021-03-29 NOTE — Telephone Encounter (Signed)
Refill sent to pharmacy.   

## 2021-04-09 ENCOUNTER — Other Ambulatory Visit: Payer: Self-pay | Admitting: Family Medicine

## 2021-05-10 ENCOUNTER — Other Ambulatory Visit: Payer: Self-pay

## 2021-05-10 DIAGNOSIS — F900 Attention-deficit hyperactivity disorder, predominantly inattentive type: Secondary | ICD-10-CM

## 2021-05-10 MED ORDER — AMPHETAMINE-DEXTROAMPHETAMINE 20 MG PO TABS: 20.0000 mg | ORAL_TABLET | Freq: Two times a day (BID) | ORAL | 0 refills | Status: DC

## 2021-05-13 ENCOUNTER — Ambulatory Visit (INDEPENDENT_AMBULATORY_CARE_PROVIDER_SITE_OTHER): Payer: BC Managed Care – PPO

## 2021-05-13 ENCOUNTER — Other Ambulatory Visit: Payer: Self-pay

## 2021-05-13 ENCOUNTER — Ambulatory Visit (INDEPENDENT_AMBULATORY_CARE_PROVIDER_SITE_OTHER): Payer: BC Managed Care – PPO | Admitting: Family Medicine

## 2021-05-13 VITALS — BP 104/60 | HR 78 | Temp 97.4°F | Resp 16 | Ht 67.0 in | Wt 166.0 lb

## 2021-05-13 DIAGNOSIS — K219 Gastro-esophageal reflux disease without esophagitis: Secondary | ICD-10-CM

## 2021-05-13 DIAGNOSIS — F900 Attention-deficit hyperactivity disorder, predominantly inattentive type: Secondary | ICD-10-CM | POA: Diagnosis not present

## 2021-05-13 DIAGNOSIS — E663 Overweight: Secondary | ICD-10-CM

## 2021-05-13 DIAGNOSIS — J4540 Moderate persistent asthma, uncomplicated: Secondary | ICD-10-CM | POA: Diagnosis not present

## 2021-05-13 DIAGNOSIS — Z23 Encounter for immunization: Secondary | ICD-10-CM | POA: Diagnosis not present

## 2021-05-13 DIAGNOSIS — R7301 Impaired fasting glucose: Secondary | ICD-10-CM

## 2021-05-13 DIAGNOSIS — E782 Mixed hyperlipidemia: Secondary | ICD-10-CM

## 2021-05-13 DIAGNOSIS — R7303 Prediabetes: Secondary | ICD-10-CM

## 2021-05-13 DIAGNOSIS — F330 Major depressive disorder, recurrent, mild: Secondary | ICD-10-CM

## 2021-05-13 DIAGNOSIS — Z6826 Body mass index (BMI) 26.0-26.9, adult: Secondary | ICD-10-CM

## 2021-05-13 MED ORDER — MONTELUKAST SODIUM 10 MG PO TABS: ORAL_TABLET | ORAL | 3 refills | Status: DC

## 2021-05-13 MED ORDER — AMPHETAMINE-DEXTROAMPHETAMINE 20 MG PO TABS: 20.0000 mg | ORAL_TABLET | Freq: Two times a day (BID) | ORAL | 0 refills | Status: DC

## 2021-05-13 MED ORDER — PHENTERMINE HCL 37.5 MG PO CAPS: 37.5000 mg | ORAL_CAPSULE | ORAL | 0 refills | Status: DC

## 2021-05-13 MED ORDER — TRELEGY ELLIPTA 100-62.5-25 MCG/ACT IN AEPB: 1.0000 | INHALATION_SPRAY | Freq: Every day | RESPIRATORY_TRACT | 3 refills | Status: DC

## 2021-05-13 NOTE — Progress Notes (Signed)
Subjective:  Patient ID: Brittany Walton, female    DOB: [birth year]  Age: 60 y.o. MRN: [id number]  Chief Complaint  Patient presents with   Hypertension   Depression   Asthma    HPI Patient is a 60 year old white female who presents for chronic follow-up of hypertension, prediabetes, asthma, ADHD, hyperlipidemia, and depression.  Patient is doing excellent.  She has lost 30 pounds on the [place] program.  She feels better overall.  She is not exercising a lot.  She and her husband are leaving to go to [place] this coming Monday for 30 days and the patient is very anxious concerning this trip due to the temptation of [place] food and the inability to take her off via feelings.  Asthma: Currently on Trelegy, Singulair, and albuterol.  Patient was seen by Carollee Herter, nurse practitioner, and was given Trelegy at her last visit.  She says it is made a huge difference.  She cannot believe how much improvement she seen in her breathing.  She rarely has to use the albuterol.  Hypertension: Currently on olmesartan 20 mg once daily.  Hyperlipidemia: Zetia 10 mg once daily.  Patient has been intolerant to Crestor and to Lipitor both causing significant muscle cramps.  GERD: Currently on omeprazole 40 mg twice daily.  This works well for her reflux.  Depression and anxiety: Currently on Zoloft 50 mg once daily which she has been on for "a long time.  ADD: Currently on Adderall 20 mg twice a day.  This helps her to focus.  Current Outpatient Medications on File Prior to Visit  Medication Sig Dispense Refill   albuterol (PROVENTIL) (2.5 MG/3ML) 0.083% nebulizer solution Take 2.5 mg by nebulization every 6 (six) hours as needed for wheezing or shortness of breath.     albuterol (VENTOLIN HFA) 108 (90 Base) MCG/ACT inhaler Inhale 1-2 puffs into the lungs every 6 (six) hours as needed for wheezing or shortness of breath.     ezetimibe (ZETIA) 10 MG tablet TAKE ONE (1) TABLET ONCE DAILY 90 tablet 0    fluticasone (FLONASE) 50 MCG/ACT nasal spray Place 2 sprays into both nostrils daily. 16 g 6   olmesartan (BENICAR) 20 MG tablet TAKE ONE (1) TABLET ONCE DAILY 90 tablet 0   omeprazole (PRILOSEC) 40 MG capsule TAKE ONE CAPSULE BY MOUTH TWICE A DAY 180 capsule 3   sertraline (ZOLOFT) 50 MG tablet TAKE ONE (1) TABLET ONCE DAILY 90 tablet 2   No current facility-administered medications on file prior to visit.   Past Medical History:  Diagnosis Date   ADHD    Asthma    Biliary dyskinesia    Depression    GERD (gastroesophageal reflux disease)    Hyperlipidemia    Hypertension    Impaired fasting glucose    Obesity    Past Surgical History:  Procedure Laterality Date   ABDOMINAL HYSTERECTOMY  2006   patrial for uterine prolapse   HERNIA REPAIR  2009   umbilical, bilateral inguinal    Family History  Problem Relation Age of Onset   Heart attack Mother    Dementia Mother    Social History   Socioeconomic History   Marital status: Married    Spouse name: Not on file   Number of children: 2   Years of education: Not on file   Highest education level: Not on file  Occupational History   Occupation: Teacher- [employer]  Tobacco Use   Smoking status: Never   Smokeless  tobacco: Never  Vaping Use   Vaping Use: Never used  Substance and Sexual Activity   Alcohol use: Yes    Comment: Drinks alcohol very infrequently. She typically consumes wine.   Drug use: Never   Sexual activity: Not on file  Other Topics Concern   Not on file  Social History Narrative   Not on file   Social Determinants of Health   Financial Resource Strain: Not on file  Food Insecurity: Not on file  Transportation Needs: Not on file  Physical Activity: Not on file  Stress: Not on file  Social Connections: Not on file    Review of Systems  Constitutional:  Negative for chills, fatigue and fever.  HENT:  Negative for congestion, rhinorrhea and sore throat.   Respiratory:  Negative  for cough and shortness of breath.   Cardiovascular:  Negative for chest pain.  Gastrointestinal:  Negative for abdominal pain, constipation, diarrhea, nausea and vomiting.  Genitourinary:  Negative for dysuria and urgency.  Musculoskeletal:  Negative for back pain and myalgias.  Neurological:  Negative for dizziness, weakness, light-headedness and headaches.  Psychiatric/Behavioral:  Negative for dysphoric mood. The patient is not nervous/anxious.     Objective:  BP 104/60   Pulse 78   Temp (!) 97.4 F (36.3 C)   Resp 16   Ht 5\' 7"  (1.702 m)   Wt 166 lb (75.3 kg)   BMI 26.00 kg/m   BP/Weight 05/13/2021 02/08/2021 08/06/2020  Systolic BP 104 124 110  Diastolic BP 60 84 62  Wt. (Lbs) 166 194 194  BMI 26 30.38 30.38    Physical Exam Vitals reviewed.  Constitutional:      Appearance: Normal appearance. She is normal weight.  Neck:     Vascular: No carotid bruit.  Cardiovascular:     Rate and Rhythm: Normal rate and regular rhythm.     Heart sounds: Normal heart sounds.  Pulmonary:     Effort: Pulmonary effort is normal. No respiratory distress.     Breath sounds: Normal breath sounds.  Abdominal:     General: Abdomen is flat. Bowel sounds are normal.     Palpations: Abdomen is soft.     Tenderness: There is no abdominal tenderness.  Neurological:     Mental Status: She is alert and oriented to person, place, and time.  Psychiatric:        Mood and Affect: Mood normal.        Behavior: Behavior normal.    Diabetic Foot Exam - Simple   No data filed      Lab Results  Component Value Date   WBC 5.8 05/13/2021   HGB 15.0 05/13/2021   HCT 45.4 05/13/2021   PLT 326 05/13/2021   GLUCOSE 107 (H) 05/13/2021   CHOL 168 05/13/2021   TRIG 76 05/13/2021   HDL 57 05/13/2021   LDLCALC 97 05/13/2021   ALT 47 (H) 05/13/2021   AST 27 05/13/2021   NA 137 05/13/2021   K 5.1 05/13/2021   CL 99 05/13/2021   CREATININE 0.76 05/13/2021   BUN 16 05/13/2021   CO2 26 05/13/2021    TSH 2.310 08/06/2020   HGBA1C 6.2 (H) 05/13/2021      Assessment & Plan:   Problem List Items Addressed This Visit       Respiratory   Asthma    Continue Trelegy 1 inhalation daily.  Continue Singulair 10 mg once daily.  Keep albuterol HFA with her.  Relevant Medications   Fluticasone-Umeclidin-Vilant (TRELEGY ELLIPTA) 100-62.5-25 MCG/ACT AEPB   montelukast (SINGULAIR) 10 MG tablet     Digestive   GERD (gastroesophageal reflux disease)    Well-controlled.  Continue omeprazole twice daily.        Endocrine   Impaired fasting glucose    Encouraged to continue eating healthy.  Increase exercise which she will likely do when she goes to [place].      Relevant Orders   Hemoglobin A1c (Completed)     Other   Major depressive disorder    Well-controlled.  Continue Zoloft 50 mg once daily.      ADHD    Well-controlled.  Continue Adderall 20 mg twice daily.      Relevant Medications   amphetamine-dextroamphetamine (ADDERALL) 20 MG tablet   Overweight with body mass index (BMI) of 26 to 26.9 in adult    Patient is doing excellent.  She has dropped from the BMI of 30 down to 26. Encourage patient to continue to work on her optivia diet.  While she is in [place] I gave her some alternative fuelings.  I did recommend she continue 5-6 small meals per day. Given phentermine to help suppress her appetite loss while she is in [place].      Hyperlipidemia - Primary   Relevant Orders   CBC with Differential/Platelet (Completed)   Comprehensive metabolic panel (Completed)   Lipid panel (Completed)  .  Meds ordered this encounter  Medications   Fluticasone-Umeclidin-Vilant (TRELEGY ELLIPTA) 100-62.5-25 MCG/ACT AEPB    Sig: Inhale 1 puff into the lungs daily.    Dispense:  3 each    Refill:  3   amphetamine-dextroamphetamine (ADDERALL) 20 MG tablet    Sig: Take 1 tablet (20 mg total) by mouth 2 (two) times daily.    Dispense:  60 tablet    Refill:  0    montelukast (SINGULAIR) 10 MG tablet    Sig: TAKE ONE (1) TABLET AT BEDTIME    Dispense:  90 tablet    Refill:  3   DISCONTD: phentermine 37.5 MG capsule    Sig: Take 1 capsule (37.5 mg total) by mouth every morning.    Dispense:  30 capsule    Refill:  0    Orders Placed This Encounter  Procedures   CBC with Differential/Platelet   Comprehensive metabolic panel   Hemoglobin A1c   Lipid panel   Cardiovascular Risk Assessment     Follow-up: No follow-ups on file.  An After Visit Summary was printed and given to the patient.  Blane Ohara, MD [facility] [phone]

## 2021-05-13 NOTE — Progress Notes (Signed)
   Covid-19 Vaccination Clinic  Name:  Brittany Walton    MRN: [id number] DOB: 16-Nov-1960  05/13/2021  Ms. Brittany Walton was observed post Covid-19 immunization for 15 minutes without incident. She was provided with Vaccine Information Sheet and instruction to access the V-Safe system.   Ms. Brittany Walton was instructed to call 911 with any severe reactions post vaccine: Difficulty breathing  Swelling of face and throat  A fast heartbeat  A bad rash all over body  Dizziness and weakness

## 2021-05-14 ENCOUNTER — Other Ambulatory Visit: Payer: Self-pay | Admitting: Nurse Practitioner

## 2021-05-14 LAB — CBC WITH DIFFERENTIAL/PLATELET
Basophils Absolute: 0.1 10*3/uL (ref 0.0–0.2)
Basos: 1 %
EOS (ABSOLUTE): 0.3 10*3/uL (ref 0.0–0.4)
Eos: 6 %
Hematocrit: 45.4 % (ref 34.0–46.6)
Hemoglobin: 15 g/dL (ref 11.1–15.9)
Immature Grans (Abs): 0 10*3/uL (ref 0.0–0.1)
Immature Granulocytes: 0 %
Lymphocytes Absolute: 2 10*3/uL (ref 0.7–3.1)
Lymphs: 34 %
MCH: 29.2 pg (ref 26.6–33.0)
MCHC: 33 g/dL (ref 31.5–35.7)
MCV: 88 fL (ref 79–97)
Monocytes Absolute: 0.5 10*3/uL (ref 0.1–0.9)
Monocytes: 9 %
Neutrophils Absolute: 2.9 10*3/uL (ref 1.4–7.0)
Neutrophils: 50 %
Platelets: 326 10*3/uL (ref 150–450)
RBC: 5.14 x10E6/uL (ref 3.77–5.28)
RDW: 13.9 % (ref 11.7–15.4)
WBC: 5.8 10*3/uL (ref 3.4–10.8)

## 2021-05-14 LAB — LIPID PANEL
Chol/HDL Ratio: 2.9 ratio (ref 0.0–4.4)
Cholesterol, Total: 168 mg/dL (ref 100–199)
HDL: 57 mg/dL (ref >39)
LDL Chol Calc (NIH): 97 mg/dL (ref 0–99)
Triglycerides: 76 mg/dL (ref 0–149)
VLDL Cholesterol Cal: 14 mg/dL (ref 5–40)

## 2021-05-14 LAB — COMPREHENSIVE METABOLIC PANEL
ALT: 47 IU/L — ABNORMAL HIGH (ref 0–32)
AST: 27 IU/L (ref 0–40)
Albumin/Globulin Ratio: 2 (ref 1.2–2.2)
Albumin: 4.8 g/dL (ref 3.8–4.8)
Alkaline Phosphatase: 84 IU/L (ref 44–121)
BUN/Creatinine Ratio: 21 (ref 12–28)
BUN: 16 mg/dL (ref 8–27)
Bilirubin Total: 0.3 mg/dL (ref 0.0–1.2)
CO2: 26 mmol/L (ref 20–29)
Calcium: 9.9 mg/dL (ref 8.7–10.3)
Chloride: 99 mmol/L (ref 96–106)
Creatinine, Ser: 0.76 mg/dL (ref 0.57–1.00)
Globulin, Total: 2.4 g/dL (ref 1.5–4.5)
Glucose: 107 mg/dL — ABNORMAL HIGH (ref 70–99)
Potassium: 5.1 mmol/L (ref 3.5–5.2)
Sodium: 137 mmol/L (ref 134–144)
Total Protein: 7.2 g/dL (ref 6.0–8.5)
eGFR: 89 mL/min/{1.73_m2} (ref >59)

## 2021-05-14 LAB — HEMOGLOBIN A1C
Est. average glucose Bld gHb Est-mCnc: 131 mg/dL
Hgb A1c MFr Bld: 6.2 % — ABNORMAL HIGH (ref 4.8–5.6)

## 2021-05-16 ENCOUNTER — Other Ambulatory Visit: Payer: Self-pay

## 2021-05-16 MED ORDER — PHENTERMINE HCL 37.5 MG PO CAPS: 37.5000 mg | ORAL_CAPSULE | ORAL | 0 refills | Status: DC

## 2021-05-16 NOTE — Telephone Encounter (Signed)
Needs change in pharmacy. [facility] did not have supply. Leaving country tomorrow.   Lorita Officer, [place] 05/16/21 11:38 AM

## 2021-05-18 ENCOUNTER — Encounter: Payer: Self-pay | Admitting: Family Medicine

## 2021-05-18 DIAGNOSIS — Z6826 Body mass index (BMI) 26.0-26.9, adult: Secondary | ICD-10-CM | POA: Insufficient documentation

## 2021-05-18 DIAGNOSIS — E663 Overweight: Secondary | ICD-10-CM | POA: Insufficient documentation

## 2021-05-18 NOTE — Assessment & Plan Note (Addendum)
Continue Trelegy 1 inhalation daily.  Continue Singulair 10 mg once daily.  Keep albuterol HFA with her.

## 2021-05-18 NOTE — Assessment & Plan Note (Signed)
Well-controlled.  Continue Zoloft 50 mg once daily.  

## 2021-05-18 NOTE — Assessment & Plan Note (Signed)
Well-controlled.  Continue Adderall 20 mg twice daily. 

## 2021-05-18 NOTE — Assessment & Plan Note (Signed)
Patient is doing excellent.  She has dropped from the BMI of 30 down to 26. Encourage patient to continue to work on her optivia diet.  While she is in [place] I gave her some alternative fuelings.  I did recommend she continue 5-6 small meals per day. Given phentermine to help suppress her appetite loss while she is in [place].

## 2021-05-18 NOTE — Assessment & Plan Note (Signed)
Encouraged to continue eating healthy.  Increase exercise which she will likely do when she goes to [place].

## 2021-05-18 NOTE — Assessment & Plan Note (Signed)
Well-controlled.  Continue omeprazole twice daily.

## 2021-05-20 ENCOUNTER — Ambulatory Visit: Payer: BC Managed Care – PPO | Admitting: Family Medicine

## 2021-07-14 ENCOUNTER — Other Ambulatory Visit: Payer: Self-pay

## 2021-07-14 MED ORDER — OLMESARTAN MEDOXOMIL 20 MG PO TABS: ORAL_TABLET | ORAL | 0 refills | Status: DC

## 2021-07-14 MED ORDER — EZETIMIBE 10 MG PO TABS: ORAL_TABLET | ORAL | 0 refills | Status: DC

## 2021-07-20 ENCOUNTER — Telehealth: Payer: Self-pay

## 2021-07-20 NOTE — Telephone Encounter (Signed)
I left a message on the number(s) listed in the patients chart requesting the patient to call back regarding the upcomming appointment for 08/25/2021. The provider is out of the office that day. The appointment has been canceled. Waiting for the patient to return the call. °

## 2021-07-27 ENCOUNTER — Other Ambulatory Visit: Payer: Self-pay | Admitting: Nurse Practitioner

## 2021-08-16 ENCOUNTER — Ambulatory Visit: Payer: BC Managed Care – PPO | Admitting: Nurse Practitioner

## 2021-08-16 ENCOUNTER — Other Ambulatory Visit: Payer: Self-pay

## 2021-08-16 ENCOUNTER — Encounter: Payer: Self-pay | Admitting: Nurse Practitioner

## 2021-08-16 VITALS — BP 110/68 | HR 74 | Temp 96.9°F | Ht 67.0 in | Wt 155.0 lb

## 2021-08-16 DIAGNOSIS — I10 Essential (primary) hypertension: Secondary | ICD-10-CM

## 2021-08-16 DIAGNOSIS — Z1231 Encounter for screening mammogram for malignant neoplasm of breast: Secondary | ICD-10-CM

## 2021-08-16 DIAGNOSIS — J4540 Moderate persistent asthma, uncomplicated: Secondary | ICD-10-CM

## 2021-08-16 DIAGNOSIS — K219 Gastro-esophageal reflux disease without esophagitis: Secondary | ICD-10-CM

## 2021-08-16 DIAGNOSIS — F900 Attention-deficit hyperactivity disorder, predominantly inattentive type: Secondary | ICD-10-CM

## 2021-08-16 DIAGNOSIS — R7303 Prediabetes: Secondary | ICD-10-CM

## 2021-08-16 DIAGNOSIS — E782 Mixed hyperlipidemia: Secondary | ICD-10-CM

## 2021-08-16 MED ORDER — AMPHETAMINE-DEXTROAMPHETAMINE 20 MG PO TABS: 20.0000 mg | ORAL_TABLET | Freq: Two times a day (BID) | ORAL | 0 refills | Status: DC

## 2021-08-16 NOTE — Progress Notes (Signed)
? ?Subjective:  ?Patient ID: Brittany Walton, female    DOB: February 25, 1961  Age: 61 y.o. MRN: [id number] ? ?Chief Complaint  ?Patient presents with  ? Hyperlipidemia  ? Hypertension  ? ADHD  ? ? ?HPI ? Brittany Walton is a 61 year old Caucasian female that presents for follow-up of hypertension, hyperlipidemia, and GERD. Pt has a history of asthma that she reports is well-controlled with Trelegy inhaler daily and Singulair QHS. She has lost 11 pounds since last visit on 05/13/21 with diet and exercise modifications. Current weight 155 lbs, BMI 24.28. States she feels good. Recently traveled to [place] to see her adult daughter.  ? ?Brittany Walton has a past history of adult ADHD, predominantly inattentive type. Current treatment includes Adderall 20 mg daily. States symptoms are well-controlled denies side effects of medication. ? ?Hypertension, follow-up: ?She was last seen for hypertension 3 months ago.  ?BP at that visit was 104/60. Management since that visit includes Benicar ? ?She reports excellent compliance with treatment. ?She is not having side effects.  ?She is following a  [place]  diet. ?She is exercising. ?She does not smoke. ? ?Use of agents associated with hypertension: NSAIDS.  ? ?Symptoms: ?No chest pain No chest pressure  ?No palpitations No syncope  ?No dyspnea No orthopnea  ?No paroxysmal nocturnal dyspnea No lower extremity edema  ? ?Pertinent labs: ?Lab Results  ?Component Value Date  ? CHOL 168 05/13/2021  ? HDL 57 05/13/2021  ? [place] 97 05/13/2021  ? TRIG 76 05/13/2021  ? CHOLHDL 2.9 05/13/2021  ? Lab Results  ?Component Value Date  ? NA 137 05/13/2021  ? K 5.1 05/13/2021  ? CREATININE 0.76 05/13/2021  ? EGFR 89 05/13/2021  ? GFRNONAA 95 05/07/2020  ? GLUCOSE 107 (H) 05/13/2021  ?  ? ? ? ?Lipid/Cholesterol, Follow-up ? ?Last lipid panel Other pertinent labs  ?Lab Results  ?Component Value Date  ? CHOL 168 05/13/2021  ? HDL 57 05/13/2021  ? [place] 97 05/13/2021  ? TRIG 76 05/13/2021  ? CHOLHDL 2.9 05/13/2021  ? Lab  Results  ?Component Value Date  ? ALT 47 (H) 05/13/2021  ? AST 27 05/13/2021  ? PLT 326 05/13/2021  ? TSH 2.310 08/06/2020  ?  ? ?She was last seen for this 3 months ago.  ?Management since that visit includes Zetia. ? ?She reports excellent compliance with treatment. ?She is not having side effects.  ? ?GERD, Follow up: ?The patient was last seen for GERD 3 months ago. ?Current treatment includes Prilosec 40 mg daily ?                 She reports excellent compliance with treatment. ?She is not having side effects. ?She is NOT experiencing belching and eructation, difficulty swallowing, or heartburn  ? ?Prediabetes, Follow-up ? ?Lab Results  ?Component Value Date  ? HGBA1C 6.2 (H) 05/13/2021  ? HGBA1C 6.3 (H) 02/08/2021  ? HGBA1C 6.1 (H) 08/06/2020  ? GLUCOSE 107 (H) 05/13/2021  ? GLUCOSE 117 (H) 02/08/2021  ? GLUCOSE 112 (H) 08/06/2020  ? ? ?Last seen for for this3 months ago.  ?Management since that visit includes diet modification. ?Current symptoms include none and have been stable. ? ?Prior visit with dietician: no ?Current diet: in general, a "healthy" diet   ?Current exercise: walking ? ?Pertinent Labs: ?   ?Component Value Date/Time  ? CHOL 168 05/13/2021 1140  ? TRIG 76 05/13/2021 1140  ? CHOLHDL 2.9 05/13/2021 1140  ? CREATININE 0.76 05/13/2021 1140  ? ? ?  Wt Readings from Last 3 Encounters:  ?08/16/21 155 lb (70.3 kg)  ?05/13/21 166 lb (75.3 kg)  ?02/08/21 194 lb (88 kg)  ?  ?Current Outpatient Medications on File Prior to Visit  ?Medication Sig Dispense Refill  ? albuterol (PROVENTIL) (2.5 MG/3ML) 0.083% nebulizer solution Take 2.5 mg by nebulization every 6 (six) hours as needed for wheezing or shortness of breath.    ? albuterol (VENTOLIN HFA) 108 (90 Base) MCG/ACT inhaler Inhale 1-2 puffs into the lungs every 6 (six) hours as needed for wheezing or shortness of breath.    ? amphetamine-dextroamphetamine (ADDERALL) 20 MG tablet Take 1 tablet (20 mg total) by mouth 2 (two) times daily. 60 tablet 0  ?  ezetimibe (ZETIA) 10 MG tablet TAKE ONE (1) TABLET ONCE DAILY 90 tablet 0  ? fluticasone (FLONASE) 50 MCG/ACT nasal spray Place 2 sprays into both nostrils daily. 16 g 6  ? Fluticasone-Umeclidin-Vilant (TRELEGY ELLIPTA) 100-62.5-25 MCG/ACT AEPB Inhale 1 puff into the lungs daily. 3 each 3  ? IBU 800 MG tablet TAKE 1 TABLET 3 TIMES DAILY AS NEEDED WITH FOOD 90 tablet 1  ? montelukast (SINGULAIR) 10 MG tablet TAKE ONE (1) TABLET AT BEDTIME 90 tablet 3  ? olmesartan (BENICAR) 20 MG tablet TAKE ONE (1) TABLET ONCE DAILY 90 tablet 0  ? omeprazole (PRILOSEC) 40 MG capsule TAKE ONE CAPSULE BY MOUTH TWICE A DAY 180 capsule 3  ? phentermine (ADIPEX-P) 37.5 MG tablet TAKE 1 CAPSULE BY MOUTH EVERY MORNING 30 tablet 0  ? sertraline (ZOLOFT) 50 MG tablet TAKE ONE (1) TABLET ONCE DAILY 90 tablet 2  ? ?No current facility-administered medications on file prior to visit.  ? ?Past Medical History:  ?Diagnosis Date  ? ADHD   ? Asthma   ? Biliary dyskinesia   ? Depression   ? GERD (gastroesophageal reflux disease)   ? Hyperlipidemia   ? Hypertension   ? Impaired fasting glucose   ? Obesity   ? ?Past Surgical History:  ?Procedure Laterality Date  ? ABDOMINAL HYSTERECTOMY  2006  ? patrial for uterine prolapse  ? HERNIA REPAIR  4496  ? umbilical, bilateral inguinal  ?  ?Family History  ?Problem Relation Age of Onset  ? Heart attack Mother   ? Dementia Mother   ? ?Social History  ? ?Socioeconomic History  ? Marital status: Married  ?  Spouse name: Not on file  ? Number of children: 2  ? Years of education: Not on file  ? Highest education level: Not on file  ?Occupational History  ? Occupation: Pharmacist, hospital- [employer]  ?Tobacco Use  ? Smoking status: Never  ? Smokeless tobacco: Never  ?Vaping Use  ? Vaping Use: Never used  ?Substance and Sexual Activity  ? Alcohol use: Yes  ?  Comment: Drinks alcohol very infrequently. She typically consumes wine.  ? Drug use: Never  ? Sexual activity: Not on file  ?Other Topics Concern  ? Not on  file  ?Social History Narrative  ? Not on file  ? ?Social Determinants of Health  ? ?Financial Resource Strain: Not on file  ?Food Insecurity: Not on file  ?Transportation Needs: Not on file  ?Physical Activity: Not on file  ?Stress: Not on file  ?Social Connections: Not on file  ? ? ?Review of Systems  ?Constitutional:  Negative for chills, fatigue and fever.  ?HENT:  Negative for congestion, ear pain, rhinorrhea and sore throat.   ?Respiratory:  Negative for cough and shortness of breath.   ?  Cardiovascular:  Negative for chest pain.  ?Gastrointestinal:  Negative for abdominal pain, constipation, diarrhea, nausea and vomiting.  ?Genitourinary:  Negative for dysuria and urgency.  ?Musculoskeletal:  Negative for back pain and myalgias.  ?Neurological:  Negative for dizziness, weakness, light-headedness and headaches.  ?Psychiatric/Behavioral:  Negative for dysphoric mood. The patient is not nervous/anxious.   ? ? ?Objective:  ?BP 110/68   Pulse 74   Temp (!) 96.9 ?F (36.1 ?C)   Ht 5' 7"  (1.702 m)   Wt 155 lb (70.3 kg)   SpO2 97%   BMI 24.28 kg/m?   ? ?BP/Weight 08/16/2021 05/13/2021 02/08/2021  ?Systolic BP - 323 557  ?Diastolic BP - 60 84  ?Wt. (Lbs) 155 166 194  ?BMI 24.28 26 30.38  ? ? ?Physical Exam ?Vitals reviewed.  ?Constitutional:   ?   Appearance: Normal appearance.  ?HENT:  ?   Head: Normocephalic.  ?   Right Ear: Tympanic membrane normal.  ?   Left Ear: Tympanic membrane normal.  ?   Nose: Nose normal.  ?   Mouth/Throat:  ?   Mouth: Mucous membranes are moist.  ?Eyes:  ?   Pupils: Pupils are equal, round, and reactive to light.  ?Cardiovascular:  ?   Rate and Rhythm: Normal rate and regular rhythm.  ?   Pulses: Normal pulses.  ?   Heart sounds: Normal heart sounds.  ?Pulmonary:  ?   Effort: Pulmonary effort is normal.  ?   Breath sounds: Normal breath sounds.  ?Abdominal:  ?   General: Bowel sounds are normal.  ?   Palpations: Abdomen is soft.  ?Musculoskeletal:     ?   General: Normal range of motion.   ?   Cervical back: Neck supple.  ?Skin: ?   General: Skin is warm and dry.  ?   Capillary Refill: Capillary refill takes less than 2 seconds.  ?Neurological:  ?   General: No focal deficit present.  ?   [place]

## 2021-08-16 NOTE — Patient Instructions (Addendum)
Continue medications ?We will call you with lab results and mammogram appointment ?Follow-up in 39-months ? ? ?Health Maintenance, Female ?Adopting a healthy lifestyle and getting preventive care are important in promoting health and wellness. Ask your health care provider about: ?The right schedule for you to have regular tests and exams. ?Things you can do on your own to prevent diseases and keep yourself healthy. ?What should I know about diet, weight, and exercise? ?Eat a healthy diet ? ?Eat a diet that includes plenty of vegetables, fruits, low-fat dairy products, and lean protein. ?Do not eat a lot of foods that are high in solid fats, added sugars, or sodium. ?Maintain a healthy weight ?Body mass index (BMI) is used to identify weight problems. It estimates body fat based on height and weight. Your health care provider can help determine your BMI and help you achieve or maintain a healthy weight. ?Get regular exercise ?Get regular exercise. This is one of the most important things you can do for your health. Most adults should: ?Exercise for at least 150 minutes each week. The exercise should increase your heart rate and make you sweat (moderate-intensity exercise). ?Do strengthening exercises at least twice a week. This is in addition to the moderate-intensity exercise. ?Spend less time sitting. Even light physical activity can be beneficial. ?Watch cholesterol and blood lipids ?Have your blood tested for lipids and cholesterol at 62 years of age, then have this test every 5 years. ?Have your cholesterol levels checked more often if: ?Your lipid or cholesterol levels are high. ?You are older than 62 years of age. ?You are at high risk for heart disease. ?What should I know about cancer screening? ?Depending on your health history and family history, you may need to have cancer screening at various ages. This may include screening for: ?Breast cancer. ?Cervical cancer. ?Colorectal cancer. ?Skin cancer. ?Lung  cancer. ?What should I know about heart disease, diabetes, and high blood pressure? ?Blood pressure and heart disease ?High blood pressure causes heart disease and increases the risk of stroke. This is more likely to develop in people who have high blood pressure readings or are overweight. ?Have your blood pressure checked: ?Every 3-5 years if you are 38-70 years of age. ?Every year if you are 90 years old or older. ?Diabetes ?Have regular diabetes screenings. This checks your fasting blood sugar level. Have the screening done: ?Once every three years after age 75 if you are at a normal weight and have a low risk for diabetes. ?More often and at a younger age if you are overweight or have a high risk for diabetes. ?What should I know about preventing infection? ?Hepatitis B ?If you have a higher risk for hepatitis B, you should be screened for this virus. Talk with your health care provider to find out if you are at risk for hepatitis B infection. ?Hepatitis C ?Testing is recommended for: ?Everyone born from [birth year] through 1966. ?Anyone with known risk factors for hepatitis C. ?Sexually transmitted infections (STIs) ?Get screened for STIs, including gonorrhea and chlamydia, if: ?You are sexually active and are younger than 62 years of age. ?You are older than 63 years of age and your health care provider tells you that you are at risk for this type of infection. ?Your sexual activity has changed since you were last screened, and you are at increased risk for chlamydia or gonorrhea. Ask your health care provider if you are at risk. ?Ask your health care provider about whether you are at  high risk for HIV. Your health care provider may recommend a prescription medicine to help prevent HIV infection. If you choose to take medicine to prevent HIV, you should first get tested for HIV. You should then be tested every 3 months for as long as you are taking the medicine. ?Pregnancy ?If you are about to stop having your  period (premenopausal) and you may become pregnant, seek counseling before you get pregnant. ?Take 400 to 800 micrograms (mcg) of folic acid every day if you become pregnant. ?Ask for birth control (contraception) if you want to prevent pregnancy. ?Osteoporosis and menopause ?Osteoporosis is a disease in which the bones lose minerals and strength with aging. This can result in bone fractures. If you are 69 years old or older, or if you are at risk for osteoporosis and fractures, ask your health care provider if you should: ?Be screened for bone loss. ?Take a calcium or vitamin D supplement to lower your risk of fractures. ?Be given hormone replacement therapy (HRT) to treat symptoms of menopause. ?Follow these instructions at home: ?Alcohol use ?Do not drink alcohol if: ?Your health care provider tells you not to drink. ?You are pregnant, may be pregnant, or are planning to become pregnant. ?If you drink alcohol: ?Limit how much you have to: ?0-1 drink a day. ?Know how much alcohol is in your drink. In the [place] one drink equals one 12 oz bottle of beer (355 mL), one 5 oz glass of wine (148 mL), or one 1? oz glass of hard liquor (44 mL). ?Lifestyle ?Do not use any products that contain nicotine or tobacco. These products include cigarettes, chewing tobacco, and vaping devices, such as e-cigarettes. If you need help quitting, ask your health care provider. ?Do not use street drugs. ?Do not share needles. ?Ask your health care provider for help if you need support or information about quitting drugs. ?General instructions ?Schedule regular health, dental, and eye exams. ?Stay current with your vaccines. ?Tell your health care provider if: ?You often feel depressed. ?You have ever been abused or do not feel safe at home. ?Summary ?Adopting a healthy lifestyle and getting preventive care are important in promoting health and wellness. ?Follow your health care provider's instructions about healthy diet, exercising, and  getting tested or screened for diseases. ?Follow your health care provider's instructions on monitoring your cholesterol and blood pressure. ?This information is not intended to replace advice given to you by your health care provider. Make sure you discuss any questions you have with your health care provider. ?Document Revised: 10/11/2020 Document Reviewed: 10/11/2020 ?Elsevier Patient Education ? [place]. ? ?

## 2021-08-17 LAB — CBC WITH DIFFERENTIAL/PLATELET
Basophils Absolute: 0 10*3/uL (ref 0.0–0.2)
Basos: 0 %
EOS (ABSOLUTE): 0.3 10*3/uL (ref 0.0–0.4)
Eos: 6 %
Hematocrit: 44.5 % (ref 34.0–46.6)
Hemoglobin: 14.8 g/dL (ref 11.1–15.9)
Immature Grans (Abs): 0 10*3/uL (ref 0.0–0.1)
Immature Granulocytes: 0 %
Lymphocytes Absolute: 1.8 10*3/uL (ref 0.7–3.1)
Lymphs: 35 %
MCH: 29.8 pg (ref 26.6–33.0)
MCHC: 33.3 g/dL (ref 31.5–35.7)
MCV: 90 fL (ref 79–97)
Monocytes Absolute: 0.4 10*3/uL (ref 0.1–0.9)
Monocytes: 7 %
Neutrophils Absolute: 2.6 10*3/uL (ref 1.4–7.0)
Neutrophils: 52 %
Platelets: 312 10*3/uL (ref 150–450)
RBC: 4.97 x10E6/uL (ref 3.77–5.28)
RDW: 14.3 % (ref 11.7–15.4)
WBC: 5.1 10*3/uL (ref 3.4–10.8)

## 2021-08-17 LAB — COMPREHENSIVE METABOLIC PANEL
ALT: 28 IU/L (ref 0–32)
AST: 18 IU/L (ref 0–40)
Albumin/Globulin Ratio: 1.6 (ref 1.2–2.2)
Albumin: 4.5 g/dL (ref 3.8–4.8)
Alkaline Phosphatase: 79 IU/L (ref 44–121)
BUN/Creatinine Ratio: 21 (ref 12–28)
BUN: 16 mg/dL (ref 8–27)
Bilirubin Total: 0.3 mg/dL (ref 0.0–1.2)
CO2: 28 mmol/L (ref 20–29)
Calcium: 9.8 mg/dL (ref 8.7–10.3)
Chloride: 100 mmol/L (ref 96–106)
Creatinine, Ser: 0.75 mg/dL (ref 0.57–1.00)
Globulin, Total: 2.9 g/dL (ref 1.5–4.5)
Glucose: 109 mg/dL — ABNORMAL HIGH (ref 70–99)
Potassium: 5.2 mmol/L (ref 3.5–5.2)
Sodium: 139 mmol/L (ref 134–144)
Total Protein: 7.4 g/dL (ref 6.0–8.5)
eGFR: 91 mL/min/{1.73_m2} (ref >59)

## 2021-08-17 LAB — LIPID PANEL
Chol/HDL Ratio: 3.2 ratio (ref 0.0–4.4)
Cholesterol, Total: 183 mg/dL (ref 100–199)
HDL: 58 mg/dL (ref >39)
LDL Chol Calc (NIH): 110 mg/dL — ABNORMAL HIGH (ref 0–99)
Triglycerides: 84 mg/dL (ref 0–149)
VLDL Cholesterol Cal: 15 mg/dL (ref 5–40)

## 2021-08-17 LAB — HEMOGLOBIN A1C
Est. average glucose Bld gHb Est-mCnc: 126 mg/dL
Hgb A1c MFr Bld: 6 % — ABNORMAL HIGH (ref 4.8–5.6)

## 2021-08-17 LAB — CARDIOVASCULAR RISK ASSESSMENT

## 2021-08-24 ENCOUNTER — Other Ambulatory Visit: Payer: Self-pay | Admitting: Nurse Practitioner

## 2021-08-24 DIAGNOSIS — Z1231 Encounter for screening mammogram for malignant neoplasm of breast: Secondary | ICD-10-CM

## 2021-08-25 ENCOUNTER — Ambulatory Visit: Payer: BC Managed Care – PPO | Admitting: Family Medicine

## 2021-08-31 ENCOUNTER — Ambulatory Visit
Admission: RE | Admit: 2021-08-31 | Discharge: 2021-08-31 | Disposition: A | Discharge status: Home / Self Care | Payer: BC Managed Care – PPO | Source: Ambulatory Visit | Attending: Nurse Practitioner | Admitting: Nurse Practitioner

## 2021-08-31 DIAGNOSIS — Z1231 Encounter for screening mammogram for malignant neoplasm of breast: Secondary | ICD-10-CM

## 2021-09-05 ENCOUNTER — Other Ambulatory Visit: Payer: Self-pay | Admitting: Nurse Practitioner

## 2021-10-08 ENCOUNTER — Other Ambulatory Visit: Payer: Self-pay | Admitting: Family Medicine

## 2021-10-10 ENCOUNTER — Other Ambulatory Visit: Payer: Self-pay | Admitting: Nurse Practitioner

## 2021-10-10 NOTE — Telephone Encounter (Signed)
Refill sent to pharmacy.   

## 2021-11-25 ENCOUNTER — Other Ambulatory Visit: Payer: Self-pay | Admitting: Nurse Practitioner

## 2021-12-26 ENCOUNTER — Other Ambulatory Visit: Payer: Self-pay | Admitting: Nurse Practitioner

## 2021-12-30 ENCOUNTER — Other Ambulatory Visit: Payer: Self-pay | Admitting: Family Medicine

## 2022-01-08 ENCOUNTER — Other Ambulatory Visit: Payer: Self-pay | Admitting: Nurse Practitioner

## 2022-01-25 ENCOUNTER — Other Ambulatory Visit: Payer: Self-pay | Admitting: Nurse Practitioner

## 2022-01-27 ENCOUNTER — Other Ambulatory Visit: Payer: Self-pay

## 2022-01-27 DIAGNOSIS — F900 Attention-deficit hyperactivity disorder, predominantly inattentive type: Secondary | ICD-10-CM

## 2022-01-27 MED ORDER — AMPHETAMINE-DEXTROAMPHETAMINE 20 MG PO TABS: 20.0000 mg | ORAL_TABLET | Freq: Two times a day (BID) | ORAL | 0 refills | Status: DC

## 2022-02-16 ENCOUNTER — Ambulatory Visit: Payer: BC Managed Care – PPO | Admitting: Nurse Practitioner

## 2022-02-27 ENCOUNTER — Other Ambulatory Visit: Payer: Self-pay | Admitting: Nurse Practitioner

## 2022-03-01 ENCOUNTER — Ambulatory Visit: Payer: BC Managed Care – PPO | Admitting: Nurse Practitioner

## 2022-03-01 ENCOUNTER — Encounter: Payer: Self-pay | Admitting: Nurse Practitioner

## 2022-03-01 VITALS — BP 110/62 | HR 78 | Temp 97.8°F | Ht 67.0 in | Wt 149.0 lb

## 2022-03-01 DIAGNOSIS — I10 Essential (primary) hypertension: Secondary | ICD-10-CM

## 2022-03-01 DIAGNOSIS — J4540 Moderate persistent asthma, uncomplicated: Secondary | ICD-10-CM

## 2022-03-01 DIAGNOSIS — K219 Gastro-esophageal reflux disease without esophagitis: Secondary | ICD-10-CM

## 2022-03-01 DIAGNOSIS — Z23 Encounter for immunization: Secondary | ICD-10-CM | POA: Diagnosis not present

## 2022-03-01 DIAGNOSIS — E782 Mixed hyperlipidemia: Secondary | ICD-10-CM | POA: Diagnosis not present

## 2022-03-01 DIAGNOSIS — R7303 Prediabetes: Secondary | ICD-10-CM

## 2022-03-01 MED ORDER — OLMESARTAN MEDOXOMIL 5 MG PO TABS: 5.0000 mg | ORAL_TABLET | Freq: Every day | ORAL | 1 refills | Status: DC

## 2022-03-01 NOTE — Progress Notes (Signed)
Subjective:  Patient ID: Brittany Walton, female    DOB: 1961-01-23  Age: 62 y.o. MRN: [id number]  Chief Complaint  Patient presents with   Hypertension   Hyperlipidemia   HPI: Pt presents for follow-up of HTN, hyperlipidemia,prediabetes, and asthma. She has lost weight intentionally with [employer] [facility] and increasing physical activity. Current weight 155 lb, BMI 23.4.    Hypertension, follow-up:  She was last seen for hypertension 6 months ago.  BP at that visit was 104/60. Management includes Benicar 20 mg QD. She reports good compliance with treatment. She is not having side effects.  She is following a Low fat, Low Sodium diet. She is exercising. She does not smoke. Use of agents associated with hypertension: amphetamines.      Outside blood pressures are not being checked.  Pertinent labs: Lab Results  Component Value Date   CHOL 183 08/16/2021   HDL 58 08/16/2021   LDLCALC 110 (H) 08/16/2021   TRIG 84 08/16/2021   CHOLHDL 3.2 08/16/2021   Lab Results  Component Value Date   NA 139 08/16/2021   K 5.2 08/16/2021   CREATININE 0.75 08/16/2021   EGFR 91 08/16/2021   GFRNONAA 95 05/07/2020   GLUCOSE 109 (H) 08/16/2021     The 10-year ASCVD risk score (Arnett DK, et al., 2019) is: 3.7%     Lipid/Cholesterol, Follow-up  Last lipid panel Other pertinent labs  Lab Results  Component Value Date   CHOL 183 08/16/2021   HDL 58 08/16/2021   LDLCALC 110 (H) 08/16/2021   TRIG 84 08/16/2021   CHOLHDL 3.2 08/16/2021   Lab Results  Component Value Date   ALT 28 08/16/2021   AST 18 08/16/2021   PLT 312 08/16/2021   TSH 2.310 08/06/2020     She was last seen for this 6 months ago.  Management includes Zetia 10 mg QD. She reports excellent compliance with treatment. She is not having side effects.  Current diet: in general, a "healthy" diet   Current exercise: walking The 10-year ASCVD risk score (Arnett DK, et al., 2019) is: 3.7%   Prediabetes, Follow-up  Lab  Results  Component Value Date   HGBA1C 6.0 (H) 08/16/2021   HGBA1C 6.2 (H) 05/13/2021   HGBA1C 6.3 (H) 02/08/2021   GLUCOSE 109 (H) 08/16/2021   GLUCOSE 107 (H) 05/13/2021   GLUCOSE 117 (H) 02/08/2021    Last seen for for this6 months ago.  Management since that visit includes diet modification. Current symptoms include none and have been stable Prior visit with dietician: no Current diet: in general, a "healthy" diet   Current exercise: walking  Pertinent Labs:    Component Value Date/Time   CHOL 183 08/16/2021 1113   TRIG 84 08/16/2021 1113   CHOLHDL 3.2 08/16/2021 1113   CREATININE 0.75 08/16/2021 1113    GERD, Follow up:  The patient was last seen for GERD 6 months ago. Current treatment consist YO:VZCHYIFOYD 40 mg QD She reports excellent compliance with treatment. She is not having side effects. She is NOT experiencing belching and eructation, choking on food, or difficulty swallowing  Asthma, follow-up: Pt has life-long asthma. Current treatment includes Trelegy inhaler QD, Singulair 10 mg QHS, and Albuterol PRN. States symptoms are currently well-controlled. Denies recent exacerbations or hospitalizations.   Wt Readings from Last 3 Encounters:  03/01/22 149 lb (67.6 kg)  08/16/21 155 lb (70.3 kg)  05/13/21 166 lb (75.3 kg)     Current Outpatient Medications on File Prior to  Visit  Medication Sig Dispense Refill   albuterol (PROVENTIL) (2.5 MG/3ML) 0.083% nebulizer solution Take 2.5 mg by nebulization every 6 (six) hours as needed for wheezing or shortness of breath.     albuterol (VENTOLIN HFA) 108 (90 Base) MCG/ACT inhaler Inhale 1-2 puffs into the lungs every 6 (six) hours as needed for wheezing or shortness of breath.     amphetamine-dextroamphetamine (ADDERALL) 20 MG tablet Take 1 tablet (20 mg total) by mouth 2 (two) times daily. 60 tablet 0   ezetimibe (ZETIA) 10 MG tablet TAKE ONE (1) TABLET ONCE DAILY 90 tablet 0   fluticasone (FLONASE) 50 MCG/ACT  nasal spray Place 2 sprays into both nostrils daily. 16 g 6   Fluticasone-Umeclidin-Vilant (TRELEGY ELLIPTA) 100-62.5-25 MCG/ACT AEPB Inhale 1 puff into the lungs daily. 3 each 3   ibuprofen (ADVIL) 800 MG tablet TAKE 1 TABLET BY MOUTH THREE TIMES A DAY AS NEEDED WITH FOOD 90 tablet 1   montelukast (SINGULAIR) 10 MG tablet TAKE ONE (1) TABLET AT BEDTIME 90 tablet 3   olmesartan (BENICAR) 20 MG tablet TAKE ONE (1) TABLET ONCE DAILY 90 tablet 0   omeprazole (PRILOSEC) 40 MG capsule TAKE ONE CAPSULE BY MOUTH TWICE A DAY 180 capsule 3   phentermine (ADIPEX-P) 37.5 MG tablet TAKE 1 TABLET BY MOUTH EVERY DAY IN THE MORNING 30 tablet 0   sertraline (ZOLOFT) 50 MG tablet TAKE 1 TABLET BY MOUTH EVERY DAY 90 tablet 1   No current facility-administered medications on file prior to visit.   Past Medical History:  Diagnosis Date   ADHD    Asthma    Biliary dyskinesia    Depression    GERD (gastroesophageal reflux disease)    Hyperlipidemia    Hypertension    Impaired fasting glucose    Obesity    Past Surgical History:  Procedure Laterality Date   ABDOMINAL HYSTERECTOMY  2006   patrial for uterine prolapse   HERNIA REPAIR  6468   umbilical, bilateral inguinal    Family History  Problem Relation Age of Onset   Heart attack Mother    Dementia Mother    Breast cancer Neg Hx    Social History   Socioeconomic History   Marital status: Married    Spouse name: Not on file   Number of children: 2   Years of education: Not on file   Highest education level: Not on file  Occupational History   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 She typically consumes wine.   Drug use: Never   Sexual activity: Not on file  Other Topics Concern   Not on file  Social History Narrative   Not on  file   Social Determinants of Health   Financial Resource Strain: Not on file  Food Insecurity: Not on file  Transportation Needs: Not on file  Physical Activity: Not on file  Stress: Not on file  Social Connections: Not on file    Review of Systems  Constitutional:  Negative for appetite change, fatigue and fever.  HENT:  Negative for congestion, ear pain, sinus pressure and sore throat.   Respiratory:  Negative for cough, chest tightness, shortness of breath and wheezing.   Cardiovascular:  Negative for chest pain and palpitations.  Gastrointestinal:  Negative for  abdominal pain, constipation, diarrhea, nausea and vomiting.  Genitourinary:  Negative for dysuria and hematuria.  Musculoskeletal:  Negative for arthralgias, back pain, joint swelling and myalgias.  Skin:  Negative for rash.  Allergic/Immunologic: Positive for environmental allergies.  Neurological:  Negative for dizziness, weakness and headaches.  Psychiatric/Behavioral:  Negative for dysphoric mood. The patient is not nervous/anxious.      Objective:  BP 110/62 (BP Location: Left Arm, Patient Position: Sitting)   Pulse 78   Temp 97.8 F (36.6 C) (Temporal)   Ht _0  (1.702 m)   Wt 149 lb (67.6 kg)   SpO2 98%   BMI 23.34 kg/m       08/16/2021   10:40 AM 05/13/2021   10:47 AM 02/08/2021   10:08 AM  BP/Weight  Systolic BP [phone]  Diastolic BP 68 60 84  Wt. (Lbs) 155 166 194  BMI 24.28 kg/m2 26 kg/m2 30.38 kg/m2    Physical Exam Vitals reviewed.  Constitutional:      Appearance: Normal appearance. She is normal weight.  Cardiovascular:     Rate and Rhythm: Normal rate and regular rhythm.     Heart sounds: Normal heart sounds.  Pulmonary:     Effort: Pulmonary effort is normal.     Breath sounds: Normal breath sounds.  Abdominal:     General: Abdomen is flat. Bowel sounds are normal.     Palpations: Abdomen is soft.  Neurological:     Mental Status: She is alert and oriented to person,  place, and time.  Psychiatric:        Mood and Affect: Mood normal.        Behavior: Behavior normal.   Lab Results  Component Value Date   WBC 5.1 08/16/2021   HGB 14.8 08/16/2021   HCT 44.5 08/16/2021   PLT 312 08/16/2021   GLUCOSE 109 (H) 08/16/2021   CHOL 183 08/16/2021   TRIG 84 08/16/2021   HDL 58 08/16/2021   LDLCALC 110 (H) 08/16/2021   ALT 28 08/16/2021   AST 18 08/16/2021   NA 139 08/16/2021   K 5.2 08/16/2021   CL 100 08/16/2021   CREATININE 0.75 08/16/2021   BUN 16 08/16/2021   CO2 28 08/16/2021   TSH 2.310 08/06/2020   HGBA1C 6.0 (H) 08/16/2021      Assessment & Plan:   1. Primary hypertension-well controlled - olmesartan (BENICAR) 5 MG tablet; Take 1 tablet (5 mg total) by mouth daily.  Dispense: 90 tablet; Refill: 1 - CBC with Differential/Platelet - Comprehensive metabolic panel -stop Benicar 20 mg  2. Mixed hyperlipidemia-well controlled - CBC with Differential/Platelet - Comprehensive metabolic panel - Lipid panel -stop Zetia   3. Prediabetes-well controlled - CBC with Differential/Platelet - Comprehensive metabolic panel - Hemoglobin A1c  4. Gastroesophageal reflux disease, unspecified whether esophagitis present-well controlled - CBC with Differential/Platelet - Comprehensive metabolic panel -continue Protonix 40 mg QD -avoid foods that trigger GERD  5. Moderate persistent asthma without complication-well controlled - CBC with Differential/Platelet - Comprehensive metabolic panel -continue Trelegy and Albuterol as prescribed -continue Singulair  6. Encounter for immunization - Flu Vaccine MDCK QUAD PF   Stop Zetia Decrease Benicar to 5 mg daily Monitor BP Notify office if BP is elevated Follow-up in 62-month   Follow-up: 670-month An After Visit Summary was printed and given to the patient.   I,Lauren M Auman,acting as a scEducation administratoror ShCIT GroupNP.,have documented all relevant documentation on the behalf of ShThornell Mule  Montez Morita, NP,as directed by  Rip Harbour, NP while in the presence of Rip Harbour, NP.   I, Rip Harbour, NP, have reviewed all documentation for this visit. The documentation on 03/01/22 for the exam, diagnosis, procedures, and orders are all accurate and complete.   Signed,  Rip Harbour, NP [place] [phone]

## 2022-03-01 NOTE — Patient Instructions (Addendum)
Stop Zetia Decrease Benicar to 5 mg daily Monitor BP Notify office if BP is elevated Follow-up in 62-month  Preventive Care [id number] Old, Female Preventive care refers to lifestyle choices and visits with your health care provider that can promote health and wellness. Preventive care visits are also called wellness exams. What can I expect for my preventive care visit? Counseling Your health care provider may ask you questions about your: Medical history, including: Past medical problems. Family medical history. Pregnancy history. Current health, including: Menstrual cycle. Method of birth control. Emotional well-being. Home life and relationship well-being. Sexual activity and sexual health. Lifestyle, including: Alcohol, nicotine or tobacco, and drug use. Access to firearms. Diet, exercise, and sleep habits. Work and work eStatistician Sun[employer] use. Safety issues such as seatbelt and bike helmet use. Physical exam Your health care provider will check your: Height and weight. These may be used to calculate your BMI (body mass index). BMI is a measurement that tells if you are at a healthy weight. Waist circumference. This measures the distance around your waistline. This measurement also tells if you are at a healthy weight and may help predict your risk of certain diseases, such as type 2 diabetes and high blood pressure. Heart rate and blood pressure. Body temperature. Skin for abnormal spots. What immunizations do I need?  Vaccines are usually given at various ages, according to a schedule. Your health care provider will recommend vaccines for you based on your age, medical history, and lifestyle or other factors, such as travel or where you work. What tests do I need? Screening Your health care provider may recommend screening tests for certain conditions. This may include: Lipid and cholesterol levels. Diabetes screening. This is done by checking your blood sugar  (glucose) after you have not eaten for a while (fasting). Pelvic exam and Pap test. Hepatitis B test. Hepatitis C test. HIV (human immunodeficiency virus) test. STI (sexually transmitted infection) testing, if you are at risk. Lung cancer screening. Colorectal cancer screening. Mammogram. Talk with your health care provider about when you should start having regular mammograms. This may depend on whether you have a family history of breast cancer. BRCA-related cancer screening. This may be done if you have a family history of breast, ovarian, tubal, or peritoneal cancers. Bone density scan. This is done to screen for osteoporosis. Talk with your health care provider about your test results, treatment options, and if necessary, the need for more tests. Follow these instructions at home: Eating and drinking  Eat a diet that includes fresh fruits and vegetables, whole grains, lean protein, and low-fat dairy products. Take vitamin and mineral supplements as recommended by your health care provider. Do not drink alcohol if: Your health care provider tells you not to drink. You are pregnant, may be pregnant, or are planning to become pregnant. If you drink alcohol: Limit how much you have to 0-1 drink a day. Know how much alcohol is in your drink. In the [place] one drink equals one 12 oz bottle of beer (355 mL), one 5 oz glass of wine (148 mL), or one 1 oz glass of hard liquor (44 mL). Lifestyle Brush your teeth every morning and night with fluoride toothpaste. Floss one time each day. Exercise for at least 30 minutes 5 or more days each week. Do not use any products that contain nicotine or tobacco. These products include cigarettes, chewing tobacco, and vaping devices, such as e-cigarettes. If you need help quitting, ask your health care provider. Do  not use drugs. If you are sexually active, practice safe sex. Use a condom or other form of protection to prevent STIs. If you do not wish to  become pregnant, use a form of birth control. If you plan to become pregnant, see your health care provider for a prepregnancy visit. Take aspirin only as told by your health care provider. Make sure that you understand how much to take and what form to take. Work with your health care provider to find out whether it is safe and beneficial for you to take aspirin daily. Find healthy ways to manage stress, such as: Meditation, yoga, or listening to music. Journaling. Talking to a trusted person. Spending time with friends and family. Minimize exposure to UV radiation to reduce your risk of skin cancer. Safety Always wear your seat belt while driving or riding in a vehicle. Do not drive: If you have been drinking alcohol. Do not ride with someone who has been drinking. When you are tired or distracted. While texting. If you have been using any mind-altering substances or drugs. Wear a helmet and other protective equipment during sports activities. If you have firearms in your house, make sure you follow all gun safety procedures. Seek help if you have been physically or sexually abused. What's next? Visit your health care provider once a year for an annual wellness visit. Ask your health care provider how often you should have your eyes and teeth checked. Stay up to date on all vaccines. This information is not intended to replace advice given to you by your health care provider. Make sure you discuss any questions you have with your health care provider. Document Revised: 11/17/2020 Document Reviewed: 11/17/2020 Elsevier Patient Education  [place].

## 2022-03-02 LAB — CBC WITH DIFFERENTIAL/PLATELET
Basophils Absolute: 0.1 10*3/uL (ref 0.0–0.2)
Basos: 1 %
EOS (ABSOLUTE): 0.3 10*3/uL (ref 0.0–0.4)
Eos: 5 %
Hematocrit: 43.4 % (ref 34.0–46.6)
Hemoglobin: 14.6 g/dL (ref 11.1–15.9)
Immature Grans (Abs): 0 10*3/uL (ref 0.0–0.1)
Immature Granulocytes: 0 %
Lymphocytes Absolute: 2.3 10*3/uL (ref 0.7–3.1)
Lymphs: 40 %
MCH: 30 pg (ref 26.6–33.0)
MCHC: 33.6 g/dL (ref 31.5–35.7)
MCV: 89 fL (ref 79–97)
Monocytes Absolute: 0.4 10*3/uL (ref 0.1–0.9)
Monocytes: 8 %
Neutrophils Absolute: 2.6 10*3/uL (ref 1.4–7.0)
Neutrophils: 46 %
Platelets: 321 10*3/uL (ref 150–450)
RBC: 4.86 x10E6/uL (ref 3.77–5.28)
RDW: 13.1 % (ref 11.7–15.4)
WBC: 5.6 10*3/uL (ref 3.4–10.8)

## 2022-03-02 LAB — LIPID PANEL
Chol/HDL Ratio: 3.7 ratio (ref 0.0–4.4)
Cholesterol, Total: 227 mg/dL — ABNORMAL HIGH (ref 100–199)
HDL: 62 mg/dL (ref >39)
LDL Chol Calc (NIH): 145 mg/dL — ABNORMAL HIGH (ref 0–99)
Triglycerides: 113 mg/dL (ref 0–149)
VLDL Cholesterol Cal: 20 mg/dL (ref 5–40)

## 2022-03-02 LAB — COMPREHENSIVE METABOLIC PANEL
ALT: 23 IU/L (ref 0–32)
AST: 17 IU/L (ref 0–40)
Albumin/Globulin Ratio: 1.7 (ref 1.2–2.2)
Albumin: 4.6 g/dL (ref 3.9–4.9)
Alkaline Phosphatase: 83 IU/L (ref 44–121)
BUN/Creatinine Ratio: 29 — ABNORMAL HIGH (ref 12–28)
BUN: 20 mg/dL (ref 8–27)
Bilirubin Total: 0.2 mg/dL (ref 0.0–1.2)
CO2: 21 mmol/L (ref 20–29)
Calcium: 9.8 mg/dL (ref 8.7–10.3)
Chloride: 99 mmol/L (ref 96–106)
Creatinine, Ser: 0.68 mg/dL (ref 0.57–1.00)
Globulin, Total: 2.7 g/dL (ref 1.5–4.5)
Glucose: 104 mg/dL — ABNORMAL HIGH (ref 70–99)
Potassium: 4.6 mmol/L (ref 3.5–5.2)
Sodium: 138 mmol/L (ref 134–144)
Total Protein: 7.3 g/dL (ref 6.0–8.5)
eGFR: 98 mL/min/{1.73_m2} (ref >59)

## 2022-03-02 LAB — CARDIOVASCULAR RISK ASSESSMENT

## 2022-03-02 LAB — HEMOGLOBIN A1C
Est. average glucose Bld gHb Est-mCnc: 126 mg/dL
Hgb A1c MFr Bld: 6 % — ABNORMAL HIGH (ref 4.8–5.6)

## 2022-03-03 ENCOUNTER — Other Ambulatory Visit: Payer: Self-pay | Admitting: Nurse Practitioner

## 2022-03-03 DIAGNOSIS — J4540 Moderate persistent asthma, uncomplicated: Secondary | ICD-10-CM

## 2022-03-28 ENCOUNTER — Other Ambulatory Visit: Payer: Self-pay | Admitting: Nurse Practitioner

## 2022-04-04 ENCOUNTER — Other Ambulatory Visit: Payer: Self-pay | Admitting: Nurse Practitioner

## 2022-04-13 ENCOUNTER — Other Ambulatory Visit: Payer: Self-pay | Admitting: Nurse Practitioner

## 2022-05-19 ENCOUNTER — Other Ambulatory Visit: Payer: Self-pay | Admitting: Nurse Practitioner

## 2022-05-19 ENCOUNTER — Encounter: Payer: Self-pay | Admitting: Nurse Practitioner

## 2022-05-19 ENCOUNTER — Ambulatory Visit: Payer: BC Managed Care – PPO | Admitting: Nurse Practitioner

## 2022-05-19 VITALS — BP 120/70 | HR 77 | Temp 97.4°F | Ht 67.0 in | Wt 164.0 lb

## 2022-05-19 DIAGNOSIS — J028 Acute pharyngitis due to other specified organisms: Secondary | ICD-10-CM | POA: Diagnosis not present

## 2022-05-19 DIAGNOSIS — J4540 Moderate persistent asthma, uncomplicated: Secondary | ICD-10-CM

## 2022-05-19 MED ORDER — AZITHROMYCIN 250 MG PO TABS
ORAL_TABLET | ORAL | 0 refills | Status: AC
Start: 2022-05-19 — End: 2022-05-24

## 2022-05-19 MED ORDER — ALBUTEROL SULFATE (2.5 MG/3ML) 0.083% IN NEBU: 2.5000 mg | INHALATION_SOLUTION | Freq: Four times a day (QID) | RESPIRATORY_TRACT | 3 refills | Status: DC | PRN

## 2022-05-19 MED ORDER — ALBUTEROL SULFATE HFA 108 (90 BASE) MCG/ACT IN AERS: 1.0000 | INHALATION_SPRAY | Freq: Four times a day (QID) | RESPIRATORY_TRACT | 6 refills | Status: DC | PRN

## 2022-05-19 NOTE — Progress Notes (Signed)
Acute Office Visit  Subjective:    Patient ID: Brittany Walton, female    DOB: Oct 23, 1960, 61 y.o.   MRN: [id number]  CC: URI  HPI: Patient is in today for Upper respiratory symptoms She complains of achiness, congestion, nasal congestion, non productive cough, sinus pressure, and sore throat. Denies fever, chills, night sweats or weight loss. Onset of symptoms was  2 days ago and staying constant.She is drinking plenty of fluids. Past history is significant for occasional episodes of bronchitis and pneumonia. Patient is former smoker and has asthma. Taking robitussin which helps very little.     Past Medical History:  Diagnosis Date   ADHD    Asthma    Biliary dyskinesia    Depression    GERD (gastroesophageal reflux disease)    Hyperlipidemia    Hypertension    Impaired fasting glucose    Obesity     Past Surgical History:  Procedure Laterality Date   ABDOMINAL HYSTERECTOMY  2006   patrial for uterine prolapse   HERNIA REPAIR  6283   umbilical, bilateral inguinal    Family History  Problem Relation Age of Onset   Heart attack Mother    Dementia Mother    Breast cancer Neg Hx     Social History   Socioeconomic History   Marital status: Married    Spouse name: Not on file   Number of children: 2   Years of education: Not on file   Highest education level: Not on file  Occupational History   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 She typically consumes wine.   Drug use: Never   Sexual activity: Not on file  Other Topics Concern   Not on file  Social History Narrative   Not on file   Social Determinants of Health   Financial Resource Strain: Not on file  Food Insecurity: Not on file  Transportation Needs: Not on file  Physical Activity: Not on  file  Stress: Not on file  Social Connections: Not on file  Intimate Partner Violence: Not on file    Outpatient Medications Prior to Visit  Medication Sig Dispense Refill   albuterol (PROVENTIL) (2.5 MG/3ML) 0.083% nebulizer solution Take 2.5 mg by nebulization every 6 (six) hours as needed for wheezing or shortness of breath.     albuterol (VENTOLIN HFA) 108 (90 Base) MCG/ACT inhaler Inhale 1-2 puffs into the lungs every 6 (six) hours as needed for wheezing or shortness of breath.     amphetamine-dextroamphetamine (ADDERALL) 20 MG tablet Take 1 tablet (20 mg total) by mouth 2 (two) times daily. 60 tablet 0   ezetimibe (ZETIA) 10 MG tablet TAKE 1 TABLET BY MOUTH EVERY DAY 90 tablet 0   fluticasone (FLONASE) 50 MCG/ACT nasal spray Place 2 sprays into both nostrils daily. 16 g 6   ibuprofen (ADVIL) 800 MG tablet TAKE 1 TABLET BY MOUTH THREE TIMES A DAY AS NEEDED WITH FOOD 90 tablet 1   montelukast (SINGULAIR) 10 MG tablet TAKE ONE (1) TABLET AT BEDTIME 90 tablet 3   olmesartan (BENICAR) 5 MG tablet Take 1 tablet (5 mg total) by mouth daily. 90 tablet 1   omeprazole (PRILOSEC) 40 MG capsule TAKE ONE CAPSULE BY MOUTH TWICE A DAY 180 capsule 3  phentermine (ADIPEX-P) 37.5 MG tablet TAKE 1 TABLET BY MOUTH EVERY DAY IN THE MORNING 30 tablet 0   sertraline (ZOLOFT) 50 MG tablet TAKE 1 TABLET BY MOUTH EVERY DAY 90 tablet 1   TRELEGY ELLIPTA 100-62.5-25 MCG/ACT AEPB INHALE 1 PUFF BY MOUTH ONCE DAILY 60 each 10   No facility-administered medications prior to visit.    Allergies  Allergen Reactions   Atorvastatin Other (See Comments)    Muscle cramps   Codeine    Crestor [Rosuvastatin] Other (See Comments)    Myalgias   Diphenhydramine Hcl    Elemental Sulfur     Review of Systems See pertinent positives and negatives per HPI.     Objective:   BP 120/70   Pulse 77   Temp (!) 97.4 F (36.3 C)   Ht _0  (1.702 m)   Wt 164 lb (74.4 kg)   SpO2 97%   BMI 25.69 kg/m   Physical  Exam Vitals reviewed.  Constitutional:      Appearance: Normal appearance.  HENT:     Right Ear: Tympanic membrane normal.     Left Ear: Tympanic membrane normal.     Nose: Congestion present.     Mouth/Throat:     Pharynx: Posterior oropharyngeal erythema present.  Cardiovascular:     Rate and Rhythm: Normal rate and regular rhythm.  Pulmonary:     Effort: Pulmonary effort is normal.     Breath sounds: Normal breath sounds.  Lymphadenopathy:     Cervical: Cervical adenopathy present.  Skin:    General: Skin is warm.     Capillary Refill: Capillary refill takes less than 2 seconds.  Neurological:     Mental Status: She is alert.  Psychiatric:        Behavior: Behavior normal.      Wt Readings from Last 3 Encounters:  03/01/22 149 lb (67.6 kg)  08/16/21 155 lb (70.3 kg)  05/13/21 166 lb (75.3 kg)    Health Maintenance Due  Topic Date Due   Zoster Vaccines- Shingrix (1 of 2) Never done   PAP SMEAR-Modifier  05/06/2015   DTaP/Tdap/Td (2 - Td or Tdap) 02/06/2021   COVID-19 Vaccine (5 - 2023-24 season) 02/03/2022       Lab Results  Component Value Date   TSH 2.310 08/06/2020   Lab Results  Component Value Date   WBC 5.6 03/01/2022   HGB 14.6 03/01/2022   HCT 43.4 03/01/2022   MCV 89 03/01/2022   PLT 321 03/01/2022   Lab Results  Component Value Date   NA 138 03/01/2022   K 4.6 03/01/2022   CO2 21 03/01/2022   GLUCOSE 104 (H) 03/01/2022   BUN 20 03/01/2022   CREATININE 0.68 03/01/2022   BILITOT 0.2 03/01/2022   ALKPHOS 83 03/01/2022   AST 17 03/01/2022   ALT 23 03/01/2022   PROT 7.3 03/01/2022   ALBUMIN 4.6 03/01/2022   CALCIUM 9.8 03/01/2022   EGFR 98 03/01/2022   Lab Results  Component Value Date   CHOL 227 (H) 03/01/2022   Lab Results  Component Value Date   HDL 62 03/01/2022   Lab Results  Component Value Date   LDLCALC 145 (H) 03/01/2022   Lab Results  Component Value Date   TRIG 113 03/01/2022   Lab Results  Component Value  Date   CHOLHDL 3.7 03/01/2022   Lab Results  Component Value Date   HGBA1C 6.0 (H) 03/01/2022       Assessment & Plan:  1. Pharyngitis due to other organism - azithromycin (ZITHROMAX) 250 MG tablet; Take 2 tablets on day 1, then 1 tablet daily on days 2 through 5  Dispense: 6 tablet; Refill: 0   Warm salt water gargles Replace toothbrush and toothpaste Rest and push fluids Take Z-pack as directed, with food Follow-up as needed      Follow-up: PRN  An After Visit Summary was printed and given to the patient.  I, Rip Harbour, NP, have reviewed all documentation for this visit. The documentation on 05/19/22 for the exam, diagnosis, procedures, and orders are all accurate and complete.   Signed, Rip Harbour, NP [place] [phone]

## 2022-05-19 NOTE — Patient Instructions (Addendum)
Warm salt water gargles Replace toothbrush and toothpaste Rest and push fluids Take Z-pack as directed, with food Follow-up as needed   Pharyngitis  Pharyngitis is a sore throat (pharynx). This is when there is redness, pain, and swelling in your throat. Most of the time, this condition gets better on its own. In some cases, you may need medicine. What are the causes? An infection from a virus. An infection from bacteria. Allergies. What increases the risk? Being 5-24 years old. Being in crowded environments. These include: Daycares. Schools. Dormitories. Living in a place with cold temperatures outside. Having a weakened disease-fighting (immune) system. What are the signs or symptoms? Symptoms may vary depending on the cause. Common symptoms include: Sore throat. Tiredness (fatigue). Low-grade fever. Stuffy nose. Cough. Headache. Other symptoms may include: Glands in the neck (lymph nodes) that are swollen. Skin rashes. Film on the throat or tonsils. This can be caused by an infection from bacteria. Vomiting. Red, itchy eyes. Loss of appetite. Joint pain and muscle aches. Tonsils that are temporarily bigger than usual (enlarged). How is this treated? Many times, treatment is not needed. This condition usually gets better in 3-4 days without treatment. If the infection is caused by a bacteria, you may be need to take antibiotics. Follow these instructions at home: Medicines Take over-the-counter and prescription medicines only as told by your doctor. If you were prescribed an antibiotic medicine, take it as told by your doctor. Do not stop taking the antibiotic even if you start to feel better. Use throat lozenges or sprays to soothe your throat as told by your doctor. Children can get pharyngitis. Do not give your child aspirin. Managing pain To help with pain, try: Sipping warm liquids, such as: Broth. Herbal tea. Warm water. Eating or drinking cold or frozen  liquids, such as frozen ice pops. Rinsing your mouth (gargle) with a salt water mixture 3-4 times a day or as needed. To make salt water, dissolve -1 tsp (3-6 g) of salt in 1 cup (237 mL) of warm water. Do not swallow this mixture. Sucking on hard candy or throat lozenges. Putting a cool-mist humidifier in your bedroom at night to moisten the air. Sitting in the bathroom with the door closed for 5-10 minutes while you run hot water in the shower.  General instructions  Do not smoke or use any products that contain nicotine or tobacco. If you need help quitting, ask your doctor. Rest as told by your doctor. Drink enough fluid to keep your pee (urine) pale yellow. How is this prevented? Wash your hands often for at least 20 seconds with soap and water. If soap and water are not available, use hand sanitizer. Do not touch your eyes, nose, or mouth with unwashed hands. Wash hands after touching these areas. Do not share cups or eating utensils. Avoid close contact with people who are sick. Contact a doctor if: You have large, tender lumps in your neck. You have a rash. You cough up green, yellow-brown, or bloody spit. Get help right away if: You have a stiff neck. You drool or cannot swallow liquids. You cannot drink or take medicines without vomiting. You have very bad pain that does not go away with medicine. You have problems breathing, and it is not from a stuffy nose. You have new pain and swelling in your knees, ankles, wrists, or elbows. These symptoms may be an emergency. Get help right away. Call your local emergency services (911 in the [place] Do not wait   to see if the symptoms will go away. Do not drive yourself to the hospital. Summary Pharyngitis is a sore throat (pharynx). This is when there is redness, pain, and swelling in your throat. Most of the time, pharyngitis gets better on its own. Sometimes, you may need medicine. If you were prescribed an antibiotic medicine,  take it as told by your doctor. Do not stop taking the antibiotic even if you start to feel better. This information is not intended to replace advice given to you by your health care provider. Make sure you discuss any questions you have with your health care provider. Document Revised: 08/18/2020 Document Reviewed: 08/18/2020 Elsevier Patient Education  2023 Elsevier Inc.  

## 2022-05-31 ENCOUNTER — Other Ambulatory Visit: Payer: Self-pay | Admitting: Nurse Practitioner

## 2022-06-01 ENCOUNTER — Other Ambulatory Visit: Payer: Self-pay

## 2022-06-03 ENCOUNTER — Other Ambulatory Visit: Payer: Self-pay | Admitting: Nurse Practitioner

## 2022-06-04 ENCOUNTER — Other Ambulatory Visit: Payer: Self-pay | Admitting: Family Medicine

## 2022-06-04 DIAGNOSIS — J4540 Moderate persistent asthma, uncomplicated: Secondary | ICD-10-CM

## 2022-06-07 ENCOUNTER — Other Ambulatory Visit: Payer: Self-pay | Admitting: Nurse Practitioner

## 2022-06-08 ENCOUNTER — Other Ambulatory Visit: Payer: Self-pay | Admitting: Nurse Practitioner

## 2022-06-08 DIAGNOSIS — J028 Acute pharyngitis due to other specified organisms: Secondary | ICD-10-CM

## 2022-06-08 MED ORDER — AMOXICILLIN-POT CLAVULANATE 875-125 MG PO TABS: 1.0000 | ORAL_TABLET | Freq: Two times a day (BID) | ORAL | 0 refills | Status: DC

## 2022-06-21 ENCOUNTER — Other Ambulatory Visit: Payer: Self-pay

## 2022-06-22 ENCOUNTER — Other Ambulatory Visit: Payer: Self-pay

## 2022-06-22 DIAGNOSIS — F900 Attention-deficit hyperactivity disorder, predominantly inattentive type: Secondary | ICD-10-CM

## 2022-06-22 MED ORDER — AMPHETAMINE-DEXTROAMPHETAMINE 20 MG PO TABS: 20.0000 mg | ORAL_TABLET | Freq: Two times a day (BID) | ORAL | 0 refills | Status: DC

## 2022-07-01 ENCOUNTER — Other Ambulatory Visit: Payer: Self-pay | Admitting: Nurse Practitioner

## 2022-07-02 ENCOUNTER — Other Ambulatory Visit: Payer: Self-pay | Admitting: Nurse Practitioner

## 2022-08-02 ENCOUNTER — Telehealth: Payer: Self-pay

## 2022-08-02 NOTE — Telephone Encounter (Signed)
I left a message on the number(s) listed in the patients chart requesting the patient to call back regarding the [facility] appointment for 04/003/2024. The appointment  has been canceled. Waiting for the patient to return the call.  The appointment will need to be rescheduled near the original date.

## 2022-08-10 ENCOUNTER — Other Ambulatory Visit: Payer: Self-pay

## 2022-08-10 MED ORDER — OMEPRAZOLE 40 MG PO CPDR: 40.0000 mg | DELAYED_RELEASE_CAPSULE | Freq: Two times a day (BID) | ORAL | 3 refills | Status: DC

## 2022-08-14 ENCOUNTER — Other Ambulatory Visit: Payer: Self-pay

## 2022-08-14 DIAGNOSIS — I10 Essential (primary) hypertension: Secondary | ICD-10-CM

## 2022-08-14 MED ORDER — OLMESARTAN MEDOXOMIL 5 MG PO TABS: 5.0000 mg | ORAL_TABLET | Freq: Every day | ORAL | 0 refills | Status: DC

## 2022-08-15 ENCOUNTER — Telehealth: Payer: Self-pay

## 2022-08-15 NOTE — Telephone Encounter (Signed)
I left a message on the number(s) listed in the patients chart requesting the patient to call back regarding the [facility] appointment for 09/06/2022. Brittany Walton's last day is 08/25/2022. The appointment has been canceled. Waiting for the patient to return the call.   NOTE: THIS PATIENT WILL NEED TO SEE SALLY OR DR. COX. PATIENT NEEDS THIS APPOINTMENT BY 09/30/2022.

## 2022-09-06 ENCOUNTER — Ambulatory Visit: Payer: BC Managed Care – PPO | Admitting: Nurse Practitioner

## 2022-09-19 ENCOUNTER — Telehealth: Payer: Self-pay | Admitting: Acute Care

## 2022-09-19 NOTE — Telephone Encounter (Signed)
Spoke with patient made her aware,Patient has a history of asthma and also has inhalers if she needs them, recommended her go to urgent care due to we not having appointments till Thursday, she was fine with waiting Thursday, mad patient aware if her symptoms get worst and she start having SOB, she needs to go to nearest urgent care of hospital.

## 2022-09-19 NOTE — Telephone Encounter (Signed)
started sunday - cough, congestion, tightness in chest, using inhaler, ashma - pt wanted to make an appt after the pt told me all her sxs I told her the next opening is Thursday but I felt like she needed to speak with a nurse that she may not need to wait 2 days before being seen.

## 2022-09-21 ENCOUNTER — Ambulatory Visit: Payer: BC Managed Care – PPO | Admitting: Family Medicine

## 2022-09-21 ENCOUNTER — Encounter: Payer: Self-pay | Admitting: Family Medicine

## 2022-09-21 VITALS — BP 128/70 | HR 72 | Temp 97.3°F | Resp 18 | Ht 67.0 in | Wt 166.0 lb

## 2022-09-21 DIAGNOSIS — J181 Lobar pneumonia, unspecified organism: Secondary | ICD-10-CM

## 2022-09-21 DIAGNOSIS — J4541 Moderate persistent asthma with (acute) exacerbation: Secondary | ICD-10-CM

## 2022-09-21 LAB — POC COVID19 BINAXNOW: SARS Coronavirus 2 Ag: NEGATIVE

## 2022-09-21 MED ORDER — TRIAMCINOLONE ACETONIDE 40 MG/ML IJ SUSP
80.0000 mg | Freq: Once | INTRAMUSCULAR | Status: AC
  Administered 2022-09-21: 80 mg via INTRAMUSCULAR

## 2022-09-21 MED ORDER — AZITHROMYCIN 250 MG PO TABS: ORAL_TABLET | ORAL | 0 refills | Status: AC

## 2022-09-21 MED ORDER — CEFTRIAXONE SODIUM 1 G IJ SOLR
1.0000 g | Freq: Once | INTRAMUSCULAR | Status: AC
  Administered 2022-09-21: 1 g via INTRAMUSCULAR

## 2022-09-21 MED ORDER — PREDNISONE 10 MG PO TABS: ORAL_TABLET | ORAL | 0 refills | Status: AC

## 2022-09-21 NOTE — Progress Notes (Unsigned)
Acute Office Visit  Subjective:    Patient ID: Brittany Walton, female    DOB: [birth year], 62 y.o.   MRN: [id number]  Chief Complaint  Patient presents with   Cough    HPI: Patient is in today for cough and chest congestion.  Symptoms started on Sunday with a slight.   Past Medical History:  Diagnosis Date   ADHD    Asthma    Biliary dyskinesia    Depression    GERD (gastroesophageal reflux disease)    Hyperlipidemia    Hypertension    Impaired fasting glucose    Obesity     Past Surgical History:  Procedure Laterality Date   ABDOMINAL HYSTERECTOMY  2006   patrial for uterine prolapse   HERNIA REPAIR  2009   umbilical, bilateral inguinal    Family History  Problem Relation Age of Onset   Heart attack Mother    Dementia Mother    Breast cancer Neg Hx     Social History   Socioeconomic History   Marital status: Married    Spouse name: Not on file   Number of children: 2   Years of education: Not on file   Highest education level: Not on file  Occupational History   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 She typically consumes wine.   Drug use: Never   Sexual activity: Not on file  Other Topics Concern   Not on file  Social History Narrative   Not on file   Social Determinants of Health   Financial Resource Strain: Low Risk  (05/19/2022)   Overall Financial Resource Strain (CARDIA)    Difficulty of Paying Living Expenses: Not hard at all  Food Insecurity: No Food Insecurity (05/19/2022)   Hunger Vital Sign    Worried About Running Out of Food in the Last Year: Never true    Ran Out of Food in the Last Year: Never true  Transportation Needs: No Transportation Needs (05/19/2022)   PRAPARE - Administrator, Civil Service (Medical): No     Lack of Transportation (Non-Medical): No  Physical Activity: Sufficiently Active (05/19/2022)   Exercise Vital Sign    Days of Exercise per Week: 5 days    Minutes of Exercise per Session: 30 min  Stress: No Stress Concern Present (05/19/2022)   [employer] of Occupational Health - Occupational Stress Questionnaire    Feeling of Stress : Not at all  Social Connections: Moderately Isolated (05/19/2022)   Social Connection and Isolation Panel [NHANES]    Frequency of Communication with Friends and Family: Three times a week    Frequency of Social Gatherings with Friends and Family: Twice a week    Attends Religious Services: Never    Database administrator or Organizations: No    Attends Banker Meetings: Never    Marital Status: Married  Catering manager Violence: Not At Risk (05/19/2022)   Humiliation, Afraid, Rape, and Kick questionnaire    Fear of Current or Ex-Partner: No    Emotionally Abused: No    Physically Abused: No    Sexually Abused: No    Outpatient Medications Prior to Visit  Medication Sig Dispense Refill   albuterol (PROVENTIL) (2.5 MG/3ML) 0.083% nebulizer solution Take 3  mLs (2.5 mg total) by nebulization every 6 (six) hours as needed for wheezing or shortness of breath. 75 mL 3   albuterol (VENTOLIN HFA) 108 (90 Base) MCG/ACT inhaler Inhale 1-2 puffs into the lungs every 6 (six) hours as needed for wheezing or shortness of breath. 1 each 6   amphetamine-dextroamphetamine (ADDERALL) 20 MG tablet Take 1 tablet (20 mg total) by mouth 2 (two) times daily. 60 tablet 0   ezetimibe (ZETIA) 10 MG tablet TAKE 1 TABLET BY MOUTH EVERY DAY 90 tablet 1   fluticasone (FLONASE) 50 MCG/ACT nasal spray Place 2 sprays into both nostrils daily. 16 g 6   ibuprofen (ADVIL) 800 MG tablet TAKE 1 TABLET BY MOUTH THREE TIMES A DAY AS NEEDED WITH FOOD 90 tablet 1   montelukast (SINGULAIR) 10 MG tablet TAKE 1 TABLET BY MOUTH EVERYDAY AT BEDTIME 90 tablet 3   olmesartan  (BENICAR) 5 MG tablet Take 1 tablet (5 mg total) by mouth daily. 90 tablet 0   omeprazole (PRILOSEC) 40 MG capsule Take 1 capsule (40 mg total) by mouth 2 (two) times daily. 180 capsule 3   sertraline (ZOLOFT) 50 MG tablet TAKE 1 TABLET BY MOUTH EVERY DAY 90 tablet 1   TRELEGY ELLIPTA 100-62.5-25 MCG/ACT AEPB INHALE 1 PUFF BY MOUTH ONCE DAILY 60 each 10   amoxicillin-clavulanate (AUGMENTIN) 875-125 MG tablet Take 1 tablet by mouth 2 (two) times daily. 20 tablet 0   No facility-administered medications prior to visit.    Allergies  Allergen Reactions   Atorvastatin Other (See Comments)    Muscle cramps   Codeine    Crestor [Rosuvastatin] Other (See Comments)    Myalgias   Diphenhydramine Hcl    Elemental Sulfur     Review of Systems  HENT:  Positive for congestion.   Respiratory:  Positive for cough, chest tightness, shortness of breath and wheezing.   Musculoskeletal:  Positive for back pain.       Objective:        09/21/2022   12:39 PM 05/19/2022   10:35 AM 03/01/2022   10:20 AM  Vitals with BMI  Height     Weight 166 lbs 164 lbs 149 lbs  BMI 25.99 25.68 23.33  Systolic 128 120 161  Diastolic 70 70 62  Pulse 72 77 78    No data found.   Physical Exam Vitals reviewed.  Constitutional:      Appearance: Normal appearance.  HENT:     Right Ear: Tympanic membrane normal.     Left Ear: Tympanic membrane normal.     Nose: Congestion (erythematous) present.     Comments: Sinus tenderness    Mouth/Throat:     Pharynx: Posterior oropharyngeal erythema present. No oropharyngeal exudate.  Cardiovascular:     Rate and Rhythm: Normal rate and regular rhythm.     Heart sounds: Normal heart sounds.  Pulmonary:     Effort: Pulmonary effort is normal.     Breath sounds: Normal breath sounds. No rhonchi. Wheezes: all fields. Rales: left lower lung. Lymphadenopathy:     Cervical: No cervical adenopathy.  Skin:    General: Skin is warm and dry.   Neurological:     Mental Status: She is alert.  Psychiatric:        Mood and Affect: Mood normal.        Behavior: Behavior normal.     Health Maintenance Due  Topic Date Due   HIV Screening  Never done  Zoster Vaccines- Shingrix (1 of 2) Never done   PAP SMEAR-Modifier  05/06/2015   DTaP/Tdap/Td (2 - Td or Tdap) 02/06/2021   COVID-19 Vaccine (5 - 2023-24 season) 02/03/2022    There are no preventive care reminders to display for this patient.   Lab Results  Component Value Date   TSH 2.310 08/06/2020   Lab Results  Component Value Date   WBC 5.6 03/01/2022   HGB 14.6 03/01/2022   HCT 43.4 03/01/2022   MCV 89 03/01/2022   PLT 321 03/01/2022   Lab Results  Component Value Date   NA 138 03/01/2022   K 4.6 03/01/2022   CO2 21 03/01/2022   GLUCOSE 104 (H) 03/01/2022   BUN 20 03/01/2022   CREATININE 0.68 03/01/2022   BILITOT 0.2 03/01/2022   ALKPHOS 83 03/01/2022   AST 17 03/01/2022   ALT 23 03/01/2022   PROT 7.3 03/01/2022   ALBUMIN 4.6 03/01/2022   CALCIUM 9.8 03/01/2022   EGFR 98 03/01/2022   Lab Results  Component Value Date   CHOL 227 (H) 03/01/2022   Lab Results  Component Value Date   HDL 62 03/01/2022   Lab Results  Component Value Date   LDLCALC 145 (H) 03/01/2022   Lab Results  Component Value Date   TRIG 113 03/01/2022   Lab Results  Component Value Date   CHOLHDL 3.7 03/01/2022   Lab Results  Component Value Date   HGBA1C 6.0 (H) 03/01/2022       Assessment & Plan:  Lobar pneumonia Assessment & Plan: Zpack sent Albuterol inhaler or nebulizer four times a day x 48 hours.  Kenalog shot given.  Rocephin shot given.   Orders: -     POC COVID-19 BinaxNow -     Azithromycin; Take 2 tablets on day 1, then 1 tablet daily on days 2 through 5  Dispense: 6 tablet; Refill: 0 -     Triamcinolone Acetonide -     cefTRIAXone Sodium -     DG Chest 2 View; Future  Moderate persistent asthma with exacerbation Assessment &  Plan: Ordered Chest 2V xray, and Kenalog 80 mg IM #1 at the office. Sent pre-pack prednisone 10 mg as directed.  Orders: -     Triamcinolone Acetonide -     DG Chest 2 View; Future -     predniSONE; Take 6 tablets (60 mg total) by mouth daily with breakfast for 1 day, THEN 5 tablets (50 mg total) daily with breakfast for 1 day, THEN 4 tablets (40 mg total) daily with breakfast for 1 day, THEN 3 tablets (30 mg total) daily with breakfast for 1 day, THEN 2 tablets (20 mg total) daily with breakfast for 1 day, THEN 1 tablet (10 mg total) daily with breakfast for 1 day.  Dispense: 21 tablet; Refill: 0     Meds ordered this encounter  Medications   azithromycin (ZITHROMAX) 250 MG tablet    Sig: Take 2 tablets on day 1, then 1 tablet daily on days 2 through 5    Dispense:  6 tablet    Refill:  0   triamcinolone acetonide (KENALOG-40) injection 80 mg   cefTRIAXone (ROCEPHIN) injection 1 g   predniSONE (DELTASONE) 10 MG tablet    Sig: Take 6 tablets (60 mg total) by mouth daily with breakfast for 1 day, THEN 5 tablets (50 mg total) daily with breakfast for 1 day, THEN 4 tablets (40 mg total) daily with breakfast for 1 day, THEN 3  tablets (30 mg total) daily with breakfast for 1 day, THEN 2 tablets (20 mg total) daily with breakfast for 1 day, THEN 1 tablet (10 mg total) daily with breakfast for 1 day.    Dispense:  21 tablet    Refill:  0    Orders Placed This Encounter  Procedures   DG Chest 2 View   POC COVID-19     Follow-up: No follow-ups on file.  An After Visit Summary was printed and given to the patient.   Clayborn Bigness I Leal-Borjas,acting as a scribe for Blane Ohara, MD.,have documented all relevant documentation on the behalf of Blane Ohara, MD,as directed by  Blane Ohara, MD while in the presence of Blane Ohara, MD.   Blane Ohara, MD [facility] [phone]

## 2022-09-21 NOTE — Patient Instructions (Signed)
Zpack sent Albuterol inhaler or nebulizer four times a day x 48 hours.  Kenalog shot given.  Rocephin shot given.

## 2022-09-23 DIAGNOSIS — J181 Lobar pneumonia, unspecified organism: Secondary | ICD-10-CM | POA: Insufficient documentation

## 2022-09-23 DIAGNOSIS — J4541 Moderate persistent asthma with (acute) exacerbation: Secondary | ICD-10-CM | POA: Insufficient documentation

## 2022-09-23 HISTORY — DX: Lobar pneumonia, unspecified organism: J18.1

## 2022-09-23 NOTE — Assessment & Plan Note (Signed)
Zpack sent Albuterol inhaler or nebulizer four times a day x 48 hours.  Kenalog shot given.  Rocephin shot given.  

## 2022-09-23 NOTE — Assessment & Plan Note (Signed)
Ordered Chest 2V xray, and Kenalog 80 mg IM #1 at the office. Sent pre-pack prednisone 10 mg as directed.

## 2022-09-27 ENCOUNTER — Other Ambulatory Visit: Payer: Self-pay

## 2022-09-27 MED ORDER — IBUPROFEN 800 MG PO TABS: 800.0000 mg | ORAL_TABLET | Freq: Three times a day (TID) | ORAL | 1 refills | Status: DC | PRN

## 2022-10-03 ENCOUNTER — Other Ambulatory Visit: Payer: Self-pay

## 2022-10-03 DIAGNOSIS — F900 Attention-deficit hyperactivity disorder, predominantly inattentive type: Secondary | ICD-10-CM

## 2022-10-03 MED ORDER — AMPHETAMINE-DEXTROAMPHETAMINE 20 MG PO TABS: 20.0000 mg | ORAL_TABLET | Freq: Two times a day (BID) | ORAL | 0 refills | Status: DC

## 2022-10-07 ENCOUNTER — Ambulatory Visit: Payer: BC Managed Care – PPO | Admitting: Nurse Practitioner

## 2022-11-13 ENCOUNTER — Other Ambulatory Visit: Payer: Self-pay | Admitting: Family Medicine

## 2022-11-13 DIAGNOSIS — I10 Essential (primary) hypertension: Secondary | ICD-10-CM

## 2022-11-27 ENCOUNTER — Other Ambulatory Visit: Payer: Self-pay | Admitting: Family Medicine

## 2022-11-28 ENCOUNTER — Other Ambulatory Visit: Payer: Self-pay | Admitting: Family Medicine

## 2022-11-28 ENCOUNTER — Ambulatory Visit: Payer: BC Managed Care – PPO | Admitting: Family Medicine

## 2022-11-28 VITALS — BP 120/72 | HR 72 | Temp 96.9°F | Resp 18 | Ht 67.0 in | Wt 165.0 lb

## 2022-11-28 DIAGNOSIS — I10 Essential (primary) hypertension: Secondary | ICD-10-CM

## 2022-11-28 DIAGNOSIS — Z1231 Encounter for screening mammogram for malignant neoplasm of breast: Secondary | ICD-10-CM

## 2022-11-28 DIAGNOSIS — R7301 Impaired fasting glucose: Secondary | ICD-10-CM

## 2022-11-28 DIAGNOSIS — F909 Attention-deficit hyperactivity disorder, unspecified type: Secondary | ICD-10-CM | POA: Diagnosis not present

## 2022-11-28 DIAGNOSIS — E782 Mixed hyperlipidemia: Secondary | ICD-10-CM

## 2022-11-28 DIAGNOSIS — K219 Gastro-esophageal reflux disease without esophagitis: Secondary | ICD-10-CM

## 2022-11-28 DIAGNOSIS — F900 Attention-deficit hyperactivity disorder, predominantly inattentive type: Secondary | ICD-10-CM

## 2022-11-28 MED ORDER — CONTRAVE 8-90 MG PO TB12: ORAL_TABLET | ORAL | 2 refills | Status: DC

## 2022-11-28 MED ORDER — AMPHETAMINE-DEXTROAMPHETAMINE 20 MG PO TABS
20.0000 mg | ORAL_TABLET | Freq: Two times a day (BID) | ORAL | 0 refills | Status: DC
Start: 2022-11-28 — End: 2023-01-08

## 2022-11-28 NOTE — Patient Instructions (Addendum)
Discontinue Benicar and record bp readings. Decrease omeprazole to once daily. Continue healthy diet and exercise.   Begin contrave.

## 2022-11-28 NOTE — Progress Notes (Signed)
Subjective:  Patient ID: Brittany Walton, female    DOB: 19-Jan-1961  Age: 62 y.o. MRN: [id number]  Chief Complaint  Patient presents with   Medical Management of Chronic Issues    HPI Prediabetes:  Healthy diet and exercise.   Hyperlipidemia: Current medications: Zetia 10 mg daily.  Hypertension: Current medications: Benicar 5 mg daily.  Has been decreased as she has lost weight.  GERD: Omeprazole 40 mg twice daily, but usually only has to take it once daily.  ADHD: Adderall 20 mg twice daily. Helps her focus.     11/28/2022   10:21 AM 05/19/2022   10:38 AM 08/16/2021   10:42 AM 02/08/2021   10:11 AM 08/06/2020   10:10 AM  Depression screen PHQ 2/9  Decreased Interest 0 0 0 0 0  Down, Depressed, Hopeless 0 0 0 0 0  PHQ - 2 Score 0 0 0 0 0  Altered sleeping 0 0 0 0 1  Tired, decreased energy 1 0 0 1 1  Change in appetite 0 0 0 1 0  Feeling bad or failure about yourself  0 0 0 0 0  Trouble concentrating 0 0 0 0 0  Moving slowly or fidgety/restless 0 0 0 0 0  Suicidal thoughts 0 0 0 0 0  PHQ-9 Score 1 0 0 2 2  Difficult doing work/chores Not difficult at all Not difficult at all Not difficult at all Not difficult at all Not difficult at all        11/28/2022   10:21 AM  Fall Risk   Falls in the past year? 0  Number falls in past yr: 0  Injury with Fall? 0  Risk for fall due to : No Fall Risks  Follow up Falls evaluation completed;Falls prevention discussed    Patient Care Team: Shontia Gillooly, Fritzi Mandes, MD as PCP - General (Family Medicine)   Review of Systems  Constitutional:  Negative for chills, fatigue and fever.  HENT:  Negative for congestion, rhinorrhea and sore throat.   Respiratory:  Negative for cough and shortness of breath.   Cardiovascular:  Negative for chest pain.  Gastrointestinal:  Negative for abdominal pain, constipation, diarrhea, nausea and vomiting.  Genitourinary:  Negative for dysuria and urgency.  Musculoskeletal:  Positive for back pain. Negative  for myalgias.  Neurological:  Negative for dizziness, weakness, light-headedness and headaches.  Psychiatric/Behavioral:  Negative for dysphoric mood. The patient is not nervous/anxious.     Current Outpatient Medications on File Prior to Visit  Medication Sig Dispense Refill   albuterol (PROVENTIL) (2.5 MG/3ML) 0.083% nebulizer solution Take 3 mLs (2.5 mg total) by nebulization every 6 (six) hours as needed for wheezing or shortness of breath. 75 mL 3   albuterol (VENTOLIN HFA) 108 (90 Base) MCG/ACT inhaler Inhale 1-2 puffs into the lungs every 6 (six) hours as needed for wheezing or shortness of breath. 1 each 6   ezetimibe (ZETIA) 10 MG tablet TAKE 1 TABLET BY MOUTH EVERY DAY 90 tablet 1   fluticasone (FLONASE) 50 MCG/ACT nasal spray Place 2 sprays into both nostrils daily. 16 g 6   ibuprofen (ADVIL) 800 MG tablet TAKE 1 TABLET BY MOUTH EVERY 8 HOURS AS NEEDED 90 tablet 1   montelukast (SINGULAIR) 10 MG tablet TAKE 1 TABLET BY MOUTH EVERYDAY AT BEDTIME 90 tablet 3   omeprazole (PRILOSEC) 40 MG capsule Take 1 capsule (40 mg total) by mouth 2 (two) times daily. (Patient taking differently: Take 40 mg by mouth daily.) 180  capsule 3   sertraline (ZOLOFT) 50 MG tablet TAKE 1 TABLET BY MOUTH EVERY DAY 90 tablet 1   TRELEGY ELLIPTA 100-62.5-25 MCG/ACT AEPB INHALE 1 PUFF BY MOUTH ONCE DAILY 60 each 10   No current facility-administered medications on file prior to visit.   Physical Exam Constitutional:      Appearance: Normal appearance.  Neck:     Vascular: No carotid bruit.  Cardiovascular:     Rate and Rhythm: Normal rate and regular rhythm.     Heart sounds: Normal heart sounds.  Pulmonary:     Effort: Pulmonary effort is normal.     Breath sounds: Normal breath sounds.  Abdominal:     General: Abdomen is flat. Bowel sounds are normal.     Palpations: Abdomen is soft.     Tenderness: There is no abdominal tenderness.  Musculoskeletal:     Comments: Lower lumbar pain and tenderness    Skin:    General: Skin is warm and dry.  Neurological:     Mental Status: She is alert and oriented to person, place, and time.  Psychiatric:        Mood and Affect: Mood normal.        Behavior: Behavior normal.     Past Medical History:  Diagnosis Date   ADHD    Asthma    Biliary dyskinesia    Depression    GERD (gastroesophageal reflux disease)    Hyperlipidemia    Hypertension    Impaired fasting glucose    Obesity    Past Surgical History:  Procedure Laterality Date   ABDOMINAL HYSTERECTOMY  2006   patrial for uterine prolapse   HERNIA REPAIR  2009   umbilical, bilateral inguinal    Family History  Problem Relation Age of Onset   Heart attack Mother    Dementia Mother    Breast cancer Neg Hx    Social History   Socioeconomic History   Marital status: Married    Spouse name: Not on file   Number of children: 2   Years of education: Not on file   Highest education level: Bachelor's degree (e.g., BA, AB, BS)  Occupational History   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 She typically consumes wine.   Drug use: Never   Sexual activity: Not on file  Other Topics Concern   Not on file  Social History Narrative   Not on file   Social Determinants of Health   Financial Resource Strain: Low Risk  (11/24/2022)   Overall Financial Resource Strain (CARDIA)    Difficulty of Paying Living Expenses: Not very hard  Food Insecurity: No Food Insecurity (11/24/2022)   Hunger Vital Sign    Worried About Running Out of Food in the Last Year: Never true    Ran Out of Food in the Last Year: Never true  Transportation Needs: No Transportation Needs (11/24/2022)   PRAPARE - Administrator, Civil Service (Medical): No    Lack of Transportation (Non-Medical):  No  Physical Activity: Insufficiently Active (11/28/2022)   Exercise Vital Sign    Days of Exercise per Week: 3 days    Minutes of Exercise per Session: 30 min  Stress: No Stress Concern Present (11/24/2022)   [employer] of  Occupational Health - Occupational Stress Questionnaire    Feeling of Stress : Only a little  Social Connections: Unknown (11/24/2022)   Social Connection and Isolation Panel [NHANES]    Frequency of Communication with Friends and Family: More than three times a week    Frequency of Social Gatherings with Friends and Family: Once a week    Attends Religious Services: Patient declined    Database administrator or Organizations: Yes    Attends Banker Meetings: 1 to 4 times per year    Marital Status: Married    Objective:  BP 120/72   Pulse 72   Temp (!) 96.9 F (36.1 C)   Resp 18   Ht 5\' 7"  (1.702 m)   Wt 165 lb (74.8 kg)   BMI 25.84 kg/m      11/28/2022   10:16 AM 09/21/2022   12:39 PM 05/19/2022   10:35 AM  BP/Weight  Systolic BP 120 128 120  Diastolic BP 72 70 70  Wt. (Lbs) 165 166 164  BMI 25.84 kg/m2 26 kg/m2 25.69 kg/m2      Diabetic Foot Exam - Simple   No data filed      Lab Results  Component Value Date   WBC 5.5 11/28/2022   HGB 14.2 11/28/2022   HCT 42.7 11/28/2022   PLT 344 11/28/2022   GLUCOSE 107 (H) 11/28/2022   CHOL 199 11/28/2022   TRIG 130 11/28/2022   HDL 67 11/28/2022   LDLCALC 109 (H) 11/28/2022   ALT 19 11/28/2022   AST 17 11/28/2022   NA 139 11/28/2022   K 5.1 11/28/2022   CL 102 11/28/2022   CREATININE 0.63 11/28/2022   BUN 14 11/28/2022   CO2 25 11/28/2022   TSH 2.310 08/06/2020   HGBA1C 6.1 (H) 11/28/2022      Assessment & Plan:    Attention deficit hyperactivity disorder (ADHD), unspecified ADHD type Assessment & Plan: Well-controlled.  Continue Adderall 20 mg twice daily.   Gastroesophageal reflux disease, unspecified whether esophagitis present Assessment & Plan: Decrease  omeprazole to once daily.    Mixed hyperlipidemia Assessment & Plan: Well controlled.  No changes to medicines. Zetia 10 mg daily Continue to work on eating a healthy diet and exercise.  Labs drawn today.    Orders: -     Lipid panel  Primary hypertension Assessment & Plan: Discontinue Benicar and record bp readings.   Orders: -     Comprehensive metabolic panel -     CBC with Differential/Platelet  Impaired fasting glucose Assessment & Plan: Continue healthy diet and exercise.   Begin contrave.     Orders: -     Hemoglobin A1c  Attention deficit hyperactivity disorder (ADHD), predominantly inattentive type Assessment & Plan: Well-controlled.  Continue Adderall 20 mg twice daily.  Orders: -     Amphetamine-Dextroamphetamine; Take 1 tablet (20 mg total) by mouth 2 (two) times daily.  Dispense: 60 tablet; Refill: 0  Encounter for mammogram to establish baseline mammogram -     3D Screening Mammogram, Left and Right; Future  Other orders -     Contrave; Two pills twice daily (after completion of samples.)  Dispense: 120 tablet; Refill: 2     Meds ordered this encounter  Medications   amphetamine-dextroamphetamine (ADDERALL) 20 MG tablet    Sig: Take 1 tablet (20 mg total) by mouth 2 (two) times daily.    Dispense:  60 tablet    Refill:  0   DISCONTD:  Naltrexone-buPROPion HCl ER (CONTRAVE) 8-90 MG TB12    Sig: Twice daily (after completion of samples.)    Dispense:  120 tablet    Refill:  2   Naltrexone-buPROPion HCl ER (CONTRAVE) 8-90 MG TB12    Sig: Two pills twice daily (after completion of samples.)    Dispense:  120 tablet    Refill:  2    Orders Placed This Encounter  Procedures   MM 3D SCREENING MAMMOGRAM BILATERAL BREAST   Comprehensive metabolic panel   CBC with Differential/Platelet   Hemoglobin A1c   Lipid panel     Follow-up: No follow-ups on file.   I,Carolyn M Morrison,acting as a Neurosurgeon for Blane Ohara, MD.,have documented all  relevant documentation on the behalf of Blane Ohara, MD,as directed by  Blane Ohara, MD while in the presence of Blane Ohara, MD.   Clayborn Bigness I Leal-Borjas,acting as a scribe for Blane Ohara, MD.,have documented all relevant documentation on the behalf of Blane Ohara, MD,as directed by  Blane Ohara, MD while in the presence of Blane Ohara, MD.    An After Visit Summary was printed and given to the patient.  Blane Ohara, MD [facility] [phone]

## 2022-11-29 LAB — CBC WITH DIFFERENTIAL/PLATELET
Basophils Absolute: 0 10*3/uL (ref 0.0–0.2)
Basos: 1 %
EOS (ABSOLUTE): 0.2 10*3/uL (ref 0.0–0.4)
Eos: 4 %
Hematocrit: 42.7 % (ref 34.0–46.6)
Hemoglobin: 14.2 g/dL (ref 11.1–15.9)
Immature Grans (Abs): 0 10*3/uL (ref 0.0–0.1)
Immature Granulocytes: 0 %
Lymphocytes Absolute: 2.2 10*3/uL (ref 0.7–3.1)
Lymphs: 40 %
MCH: 29.2 pg (ref 26.6–33.0)
MCHC: 33.3 g/dL (ref 31.5–35.7)
MCV: 88 fL (ref 79–97)
Monocytes Absolute: 0.4 10*3/uL (ref 0.1–0.9)
Monocytes: 7 %
Neutrophils Absolute: 2.6 10*3/uL (ref 1.4–7.0)
Neutrophils: 48 %
Platelets: 344 10*3/uL (ref 150–450)
RBC: 4.86 x10E6/uL (ref 3.77–5.28)
RDW: 13.8 % (ref 11.7–15.4)
WBC: 5.5 10*3/uL (ref 3.4–10.8)

## 2022-11-29 LAB — COMPREHENSIVE METABOLIC PANEL
ALT: 19 IU/L (ref 0–32)
AST: 17 IU/L (ref 0–40)
Albumin: 4.6 g/dL (ref 3.9–4.9)
Alkaline Phosphatase: 93 IU/L (ref 44–121)
BUN/Creatinine Ratio: 22 (ref 12–28)
BUN: 14 mg/dL (ref 8–27)
Bilirubin Total: 0.2 mg/dL (ref 0.0–1.2)
CO2: 25 mmol/L (ref 20–29)
Calcium: 9.7 mg/dL (ref 8.7–10.3)
Chloride: 102 mmol/L (ref 96–106)
Creatinine, Ser: 0.63 mg/dL (ref 0.57–1.00)
Globulin, Total: 2.4 g/dL (ref 1.5–4.5)
Glucose: 107 mg/dL — ABNORMAL HIGH (ref 70–99)
Potassium: 5.1 mmol/L (ref 3.5–5.2)
Sodium: 139 mmol/L (ref 134–144)
Total Protein: 7 g/dL (ref 6.0–8.5)
eGFR: 100 mL/min/{1.73_m2} (ref >59)

## 2022-11-29 LAB — HEMOGLOBIN A1C
Est. average glucose Bld gHb Est-mCnc: 128 mg/dL
Hgb A1c MFr Bld: 6.1 % — ABNORMAL HIGH (ref 4.8–5.6)

## 2022-11-29 LAB — LIPID PANEL
Chol/HDL Ratio: 3 ratio (ref 0.0–4.4)
Cholesterol, Total: 199 mg/dL (ref 100–199)
HDL: 67 mg/dL (ref >39)
LDL Chol Calc (NIH): 109 mg/dL — ABNORMAL HIGH (ref 0–99)
Triglycerides: 130 mg/dL (ref 0–149)
VLDL Cholesterol Cal: 23 mg/dL (ref 5–40)

## 2022-11-30 NOTE — Assessment & Plan Note (Signed)
Discontinue Benicar and record bp readings.

## 2022-11-30 NOTE — Assessment & Plan Note (Addendum)
Decrease omeprazole to once daily

## 2022-11-30 NOTE — Assessment & Plan Note (Signed)
Well controlled.  No changes to medicines. Zetia 10 mg daily. Continue to work on eating a healthy diet and exercise.  Labs drawn today.   

## 2022-11-30 NOTE — Assessment & Plan Note (Addendum)
Continue healthy diet and exercise.   Begin contrave.

## 2022-11-30 NOTE — Assessment & Plan Note (Signed)
Well-controlled.  Continue Adderall 20 mg twice daily. °

## 2022-12-02 ENCOUNTER — Encounter: Payer: Self-pay | Admitting: Family Medicine

## 2022-12-12 ENCOUNTER — Other Ambulatory Visit: Payer: Self-pay | Admitting: Family Medicine

## 2022-12-12 MED ORDER — CONTRAVE 8-90 MG PO TB12: ORAL_TABLET | ORAL | 2 refills | Status: DC

## 2022-12-16 IMAGING — MG MM DIGITAL SCREENING BILAT W/ TOMO AND CAD
6 of 10 series · 6 of 30 positions shown · non-contrast
Comparison: Previous exam(s).

CLINICAL DATA: Screening.

EXAM:
DIGITAL SCREENING BILATERAL MAMMOGRAM WITH TOMOSYNTHESIS AND CAD
TECHNIQUE: Bilateral screening digital craniocaudal and mediolateral oblique
mammograms were obtained. Bilateral screening digital breast
tomosynthesis was performed. The images were evaluated with
computer-aided detection.

[L CC synth-2D]
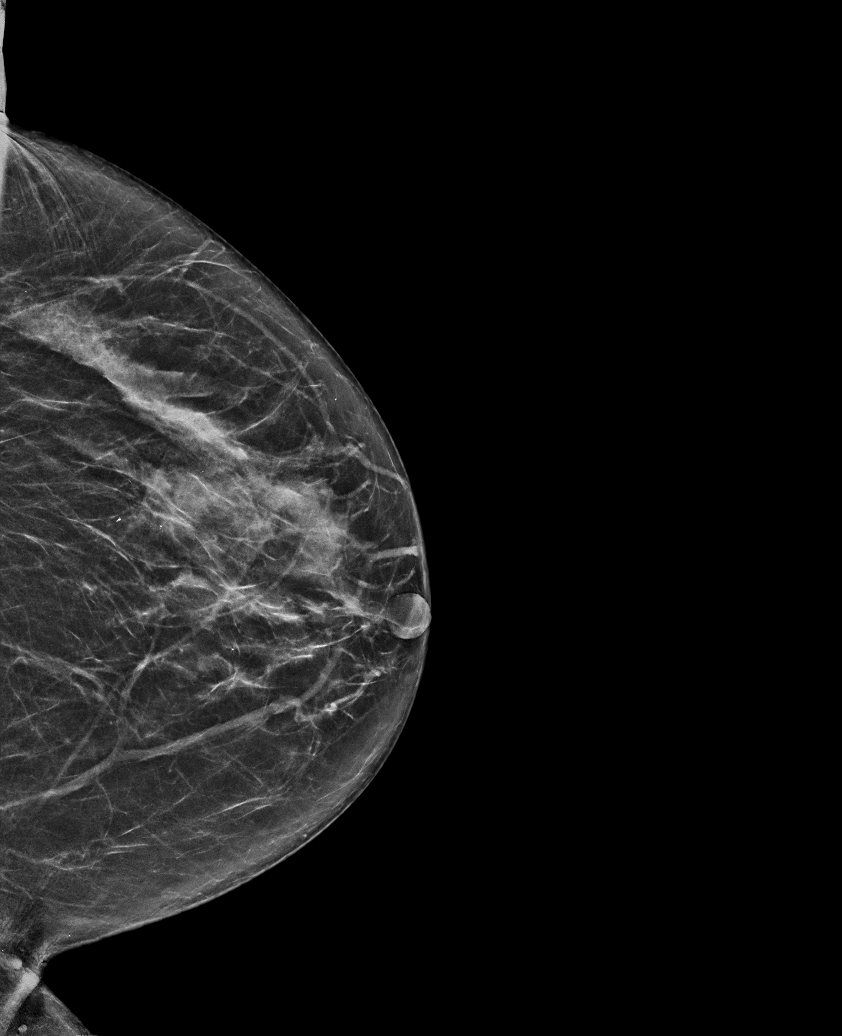

[R MLO synth-2D]
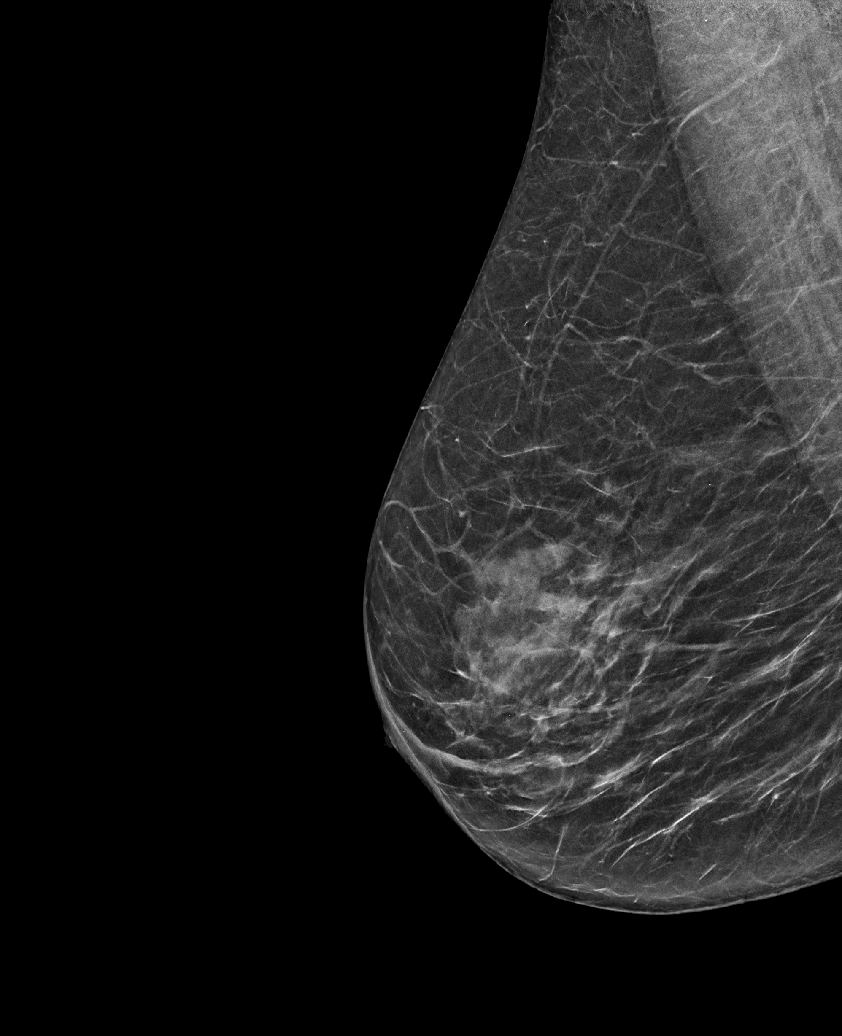

[L XCCL synth-2D]
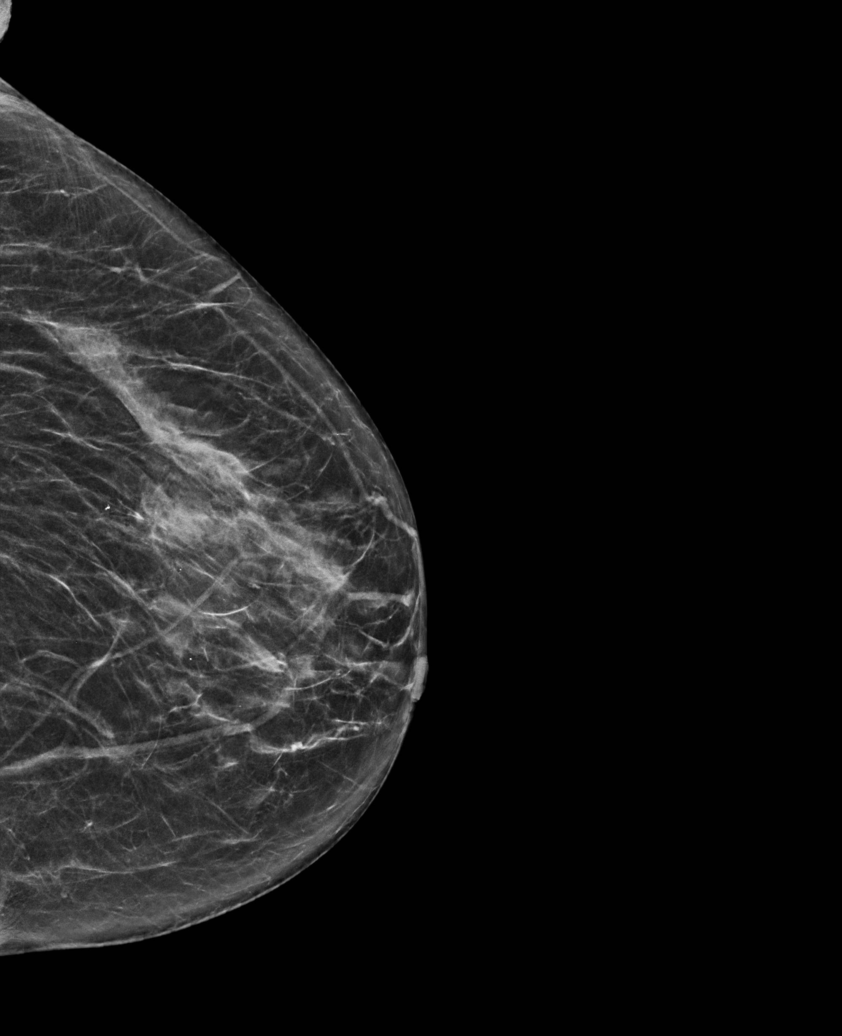

[L MLO synth-2D]
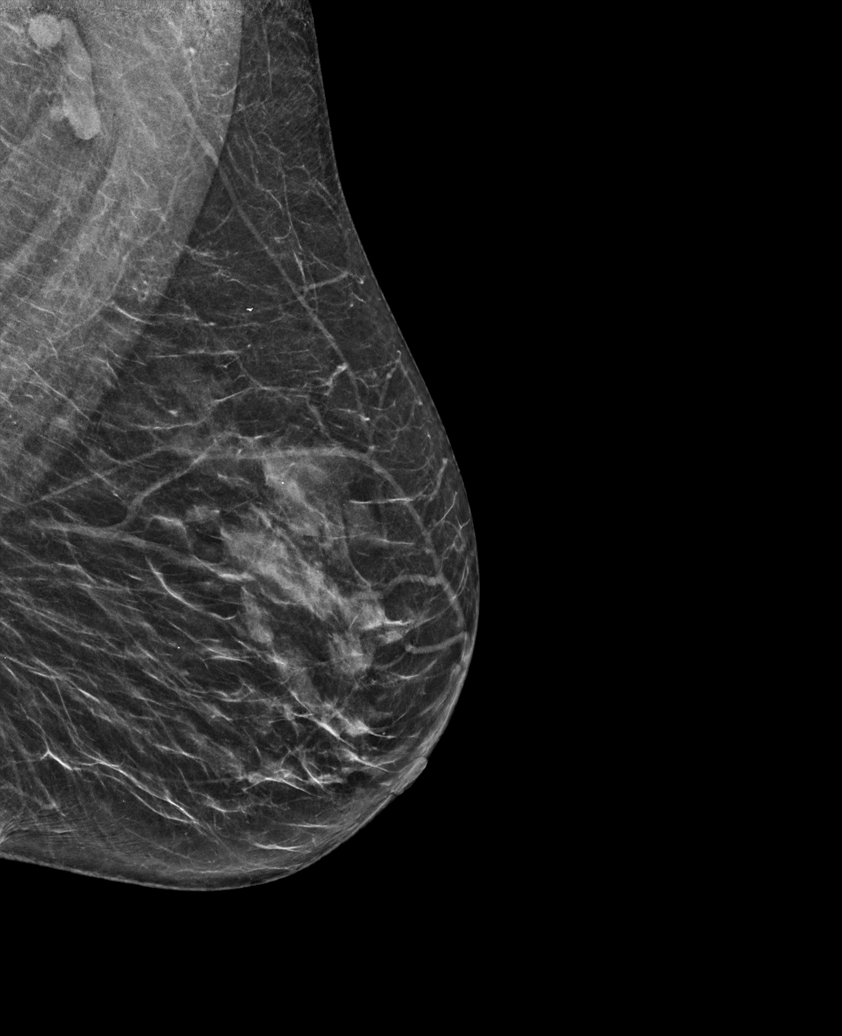

[R CC synth-2D]
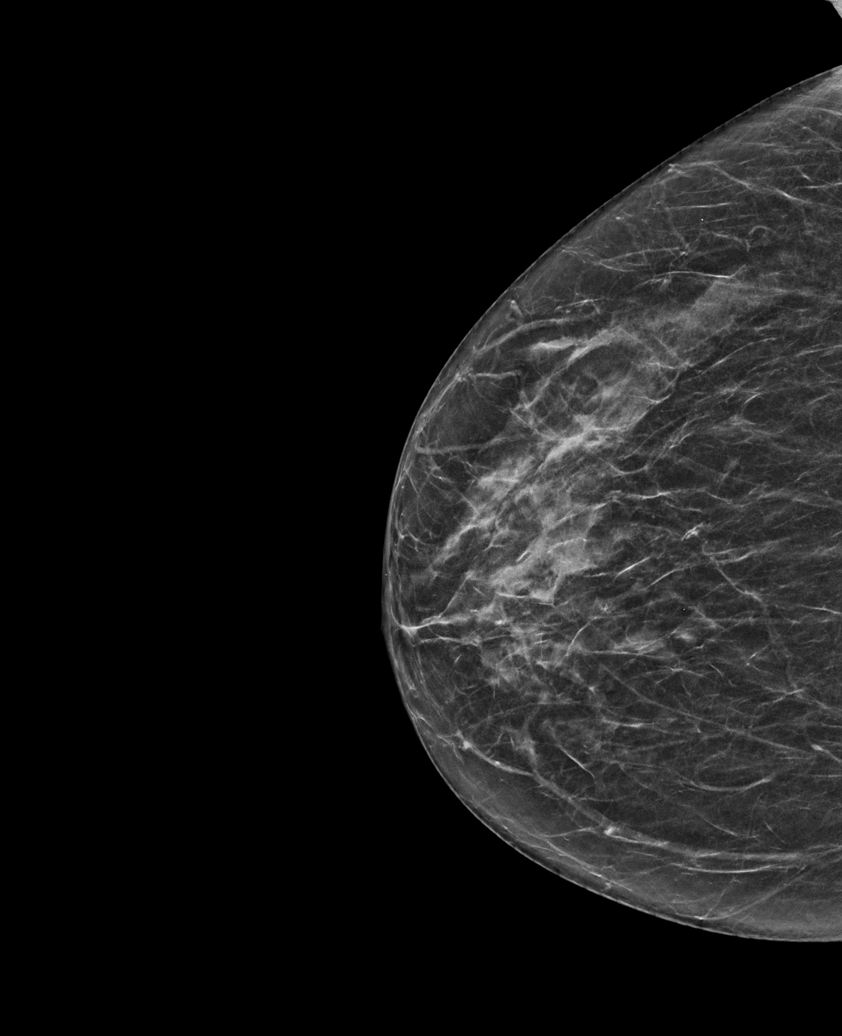

[R MLO tomo · tomo slice 29/58.0]
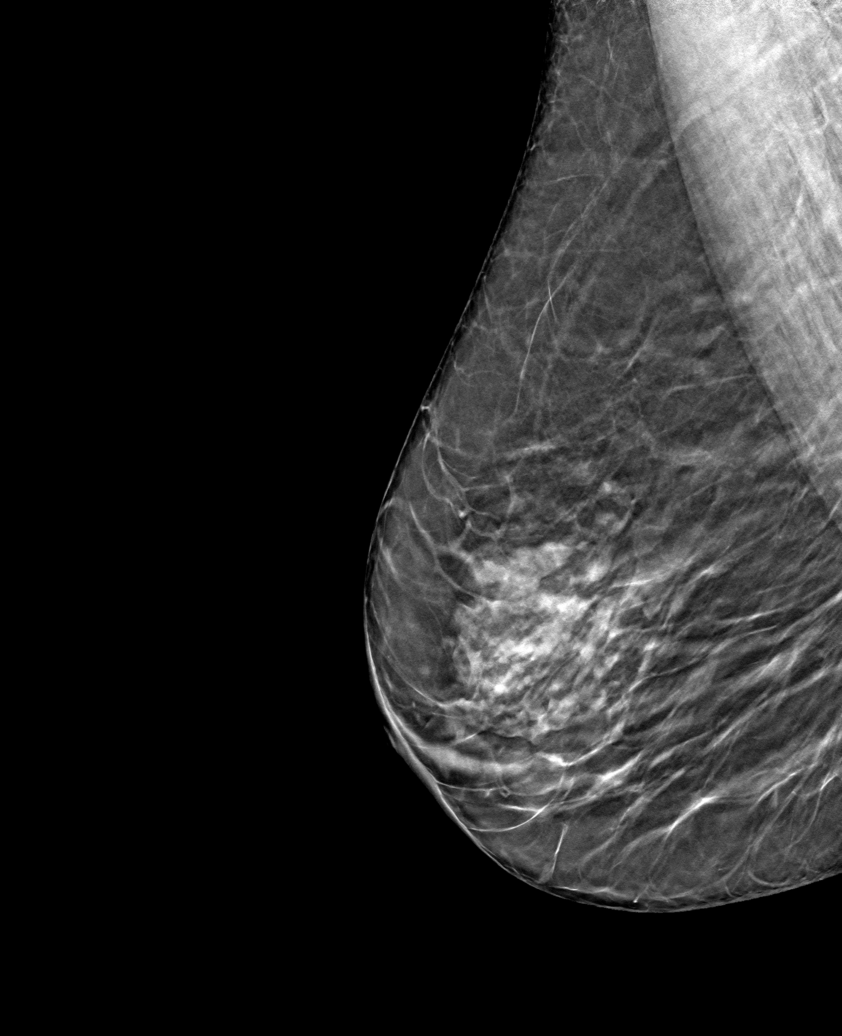

[6 of 30 positions shown; findings below may reference images not displayed]

ACR Breast Density Category b: There are scattered areas of
fibroglandular density.
FINDINGS: There are no findings suspicious for malignancy.
IMPRESSION: No mammographic evidence of malignancy. A result letter of this
screening mammogram will be mailed directly to the patient.

RECOMMENDATION:
Screening mammogram in one year. (Code:51-O-LD2)

BI-RADS CATEGORY  1: Negative.

## 2022-12-29 ENCOUNTER — Other Ambulatory Visit: Payer: Self-pay | Admitting: Family Medicine

## 2023-01-08 ENCOUNTER — Other Ambulatory Visit: Payer: Self-pay

## 2023-01-08 ENCOUNTER — Telehealth: Payer: Self-pay

## 2023-01-08 DIAGNOSIS — J4540 Moderate persistent asthma, uncomplicated: Secondary | ICD-10-CM

## 2023-01-08 DIAGNOSIS — F900 Attention-deficit hyperactivity disorder, predominantly inattentive type: Secondary | ICD-10-CM

## 2023-01-08 NOTE — Telephone Encounter (Signed)
Prescription Request  01/08/2023   What is the name of the medication or equipment? amphetamine-dextroamphetamine (ADDERALL) 20 MG tablet and TRELEGY ELLIPTA 100-62.5-25 MCG/ACT AEPB    Have you contacted your pharmacy to request a refill? No   Which pharmacy would you like this sent to?  [id number] Rosalita Levan, [place] - [address] [place] [address] Phone: [phone] Fax: [phone]    Patient notified that their request is being sent to the clinical staff for review and that they should receive a response within 2 business days.   Please advise at [facility] [phone]

## 2023-01-09 MED ORDER — TRELEGY ELLIPTA 100-62.5-25 MCG/ACT IN AEPB
INHALATION_SPRAY | RESPIRATORY_TRACT | 1 refills | Status: DC
Start: 2023-01-09 — End: 2023-03-15

## 2023-01-09 MED ORDER — AMPHETAMINE-DEXTROAMPHETAMINE 20 MG PO TABS
20.0000 mg | ORAL_TABLET | Freq: Two times a day (BID) | ORAL | 0 refills | Status: DC
Start: 2023-01-09 — End: 2023-02-22

## 2023-02-11 ENCOUNTER — Other Ambulatory Visit: Payer: Self-pay | Admitting: Physician Assistant

## 2023-02-11 DIAGNOSIS — I10 Essential (primary) hypertension: Secondary | ICD-10-CM

## 2023-02-12 ENCOUNTER — Other Ambulatory Visit: Payer: Self-pay | Admitting: Family Medicine

## 2023-02-22 ENCOUNTER — Other Ambulatory Visit: Payer: Self-pay | Admitting: Family Medicine

## 2023-02-22 DIAGNOSIS — F900 Attention-deficit hyperactivity disorder, predominantly inattentive type: Secondary | ICD-10-CM

## 2023-02-22 MED ORDER — AMPHETAMINE-DEXTROAMPHETAMINE 20 MG PO TABS: 20.0000 mg | ORAL_TABLET | Freq: Two times a day (BID) | ORAL | 0 refills | Status: DC

## 2023-03-15 ENCOUNTER — Ambulatory Visit: Payer: BC Managed Care – PPO | Admitting: Family Medicine

## 2023-03-15 VITALS — BP 112/64 | HR 67 | Temp 97.5°F | Ht 67.0 in | Wt 172.0 lb

## 2023-03-15 DIAGNOSIS — R0789 Other chest pain: Secondary | ICD-10-CM | POA: Diagnosis not present

## 2023-03-15 DIAGNOSIS — E782 Mixed hyperlipidemia: Secondary | ICD-10-CM

## 2023-03-15 DIAGNOSIS — Z23 Encounter for immunization: Secondary | ICD-10-CM | POA: Diagnosis not present

## 2023-03-15 DIAGNOSIS — Z90710 Acquired absence of both cervix and uterus: Secondary | ICD-10-CM

## 2023-03-15 DIAGNOSIS — F900 Attention-deficit hyperactivity disorder, predominantly inattentive type: Secondary | ICD-10-CM

## 2023-03-15 DIAGNOSIS — R7301 Impaired fasting glucose: Secondary | ICD-10-CM

## 2023-03-15 DIAGNOSIS — J4540 Moderate persistent asthma, uncomplicated: Secondary | ICD-10-CM

## 2023-03-15 DIAGNOSIS — R0781 Pleurodynia: Secondary | ICD-10-CM | POA: Insufficient documentation

## 2023-03-15 DIAGNOSIS — I10 Essential (primary) hypertension: Secondary | ICD-10-CM

## 2023-03-15 DIAGNOSIS — K219 Gastro-esophageal reflux disease without esophagitis: Secondary | ICD-10-CM

## 2023-03-15 MED ORDER — TRELEGY ELLIPTA 100-62.5-25 MCG/ACT IN AEPB
INHALATION_SPRAY | RESPIRATORY_TRACT | 1 refills | Status: DC
Start: 2023-03-15 — End: 2024-01-11

## 2023-03-15 NOTE — Progress Notes (Unsigned)
Subjective:  Patient ID: Brittany Walton, female    DOB: [birth year]  Age: 62 y.o. MRN: [id number]  Chief Complaint  Patient presents with   Medical Management of Chronic Issues    HPI Prediabetes:  Healthy diet and exercise. Last A1c 6.1   Hyperlipidemia: Current medications: Zetia 10 mg daily.   Hypertension: Current medications: Benicar 5 mg daily.    GERD: Omeprazole 40 mg twice daily, but usually only has to take it once daily.   ADHD: Adderall 20 mg twice daily. Helps her focus.   Patient was leaning over console in care and felt a sharp pain in right lower ribs. Hurts to breath. Hurts at night.  Motrin and tylenol. Not helped much.  Depression: on sertraline 50 mg daily.      03/15/2023   10:28 AM 11/28/2022   10:21 AM 05/19/2022   10:38 AM 08/16/2021   10:42 AM 02/08/2021   10:11 AM  Depression screen PHQ 2/9  Decreased Interest 0 0 0 0 0  Down, Depressed, Hopeless 0 0 0 0 0  PHQ - 2 Score 0 0 0 0 0  Altered sleeping 0 0 0 0 0  Tired, decreased energy 0 1 0 0 1  Change in appetite 0 0 0 0 1  Feeling bad or failure about yourself  0 0 0 0 0  Trouble concentrating 0 0 0 0 0  Moving slowly or fidgety/restless 0 0 0 0 0  Suicidal thoughts 0 0 0 0 0  PHQ-9 Score 0 1 0 0 2  Difficult doing work/chores Not difficult at all Not difficult at all Not difficult at all Not difficult at all Not difficult at all        03/15/2023   10:28 AM  Fall Risk   Number falls in past yr: 0  Injury with Fall? 0  Follow up Falls evaluation completed    Patient Care Team: Blane Ohara, MD as PCP - General (Family Medicine)   Review of Systems  Constitutional:  Negative for chills, fatigue and fever.  HENT:  Negative for congestion, ear pain, rhinorrhea and sore throat.   Respiratory:  Negative for cough and shortness of breath.   Cardiovascular:  Negative for chest pain.  Gastrointestinal:  Negative for abdominal pain, constipation, diarrhea, nausea and vomiting.   Genitourinary:  Negative for dysuria and urgency.  Musculoskeletal:  Negative for back pain and myalgias.  Neurological:  Negative for dizziness, weakness, light-headedness and headaches.  Psychiatric/Behavioral:  Negative for dysphoric mood. The patient is not nervous/anxious.     Current Outpatient Medications on File Prior to Visit  Medication Sig Dispense Refill   albuterol (PROVENTIL) (2.5 MG/3ML) 0.083% nebulizer solution Take 3 mLs (2.5 mg total) by nebulization every 6 (six) hours as needed for wheezing or shortness of breath. 75 mL 3   albuterol (VENTOLIN HFA) 108 (90 Base) MCG/ACT inhaler Inhale 1-2 puffs into the lungs every 6 (six) hours as needed for wheezing or shortness of breath. 1 each 6   amphetamine-dextroamphetamine (ADDERALL) 20 MG tablet Take 1 tablet (20 mg total) by mouth 2 (two) times daily. 60 tablet 0   ezetimibe (ZETIA) 10 MG tablet TAKE 1 TABLET BY MOUTH EVERY DAY 90 tablet 0   fluticasone (FLONASE) 50 MCG/ACT nasal spray Place 2 sprays into both nostrils daily. 16 g 6   ibuprofen (ADVIL) 800 MG tablet TAKE 1 TABLET BY MOUTH EVERY 8 HOURS AS NEEDED 90 tablet 1   montelukast (SINGULAIR) 10 MG  tablet TAKE 1 TABLET BY MOUTH EVERYDAY AT BEDTIME 90 tablet 3   olmesartan (BENICAR) 5 MG tablet TAKE 1 TABLET (5 MG TOTAL) BY MOUTH DAILY. 90 tablet 0   omeprazole (PRILOSEC) 40 MG capsule Take 1 capsule (40 mg total) by mouth 2 (two) times daily. (Patient taking differently: Take 40 mg by mouth daily.) 180 capsule 3   sertraline (ZOLOFT) 50 MG tablet TAKE 1 TABLET BY MOUTH EVERY DAY 90 tablet 0   No current facility-administered medications on file prior to visit.   Past Medical History:  Diagnosis Date   ADHD    Asthma    Biliary dyskinesia    Depression    GERD (gastroesophageal reflux disease)    Hyperlipidemia    Hypertension    Impaired fasting glucose    Lobar pneumonia (HCC) 09/23/2022   Obesity    Past Surgical History:  Procedure Laterality Date    ABDOMINAL HYSTERECTOMY  2006   patrial for uterine prolapse   HERNIA REPAIR  2009   umbilical, bilateral inguinal    Family History  Problem Relation Age of Onset   Heart attack Mother    Dementia Mother    Breast cancer Neg Hx    Social History   Socioeconomic History   Marital status: Married    Spouse name: Not on file   Number of children: 2   Years of education: Not on file   Highest education level: Bachelor's degree (e.g., BA, AB, BS)  Occupational History   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 She typically consumes wine.   Drug use: Never   Sexual activity: Not on file  Other Topics Concern   Not on file  Social History Narrative   Not on file   Social Determinants of Health   Financial Resource Strain: Low Risk  (11/24/2022)   Overall Financial Resource Strain (CARDIA)    Difficulty of Paying Living Expenses: Not very hard  Food Insecurity: No Food Insecurity (11/24/2022)   Hunger Vital Sign    Worried About Running Out of Food in the Last Year: Never true    Ran Out of Food in the Last Year: Never true  Transportation Needs: No Transportation Needs (11/24/2022)   PRAPARE - Administrator, Civil Service (Medical): No    Lack of Transportation (Non-Medical): No  Physical Activity: Insufficiently Active (11/28/2022)   Exercise Vital Sign    Days of Exercise per Week: 3 days    Minutes of Exercise per Session: 30 min  Stress: No Stress Concern Present (11/24/2022)   [employer] of Occupational Health - Occupational Stress Questionnaire    Feeling of Stress : Only a little  Social Connections: Socially Integrated (03/15/2023)   Social Connection and Isolation Panel [NHANES]    Frequency of Communication with Friends and Family: More than  three times a week    Frequency of Social Gatherings with Friends and Family: Once a week    Attends Religious Services: More than 4 times per year    Active Member of [employer] or Organizations: Yes    Attends Banker Meetings: 1 to 4 times per year    Marital Status: Married    Objective:  BP 112/64   Pulse 67   Temp (!)  97.5 F (36.4 C)   Ht 5\' 7"  (1.702 m)   Wt 172 lb (78 kg)   SpO2 98%   BMI 26.94 kg/m      03/15/2023   10:27 AM 11/28/2022   10:16 AM 09/21/2022   12:39 PM  BP/Weight  Systolic BP 112 120 128  Diastolic BP 64 72 70  Wt. (Lbs) 172 165 166  BMI 26.94 kg/m2 25.84 kg/m2 26 kg/m2    Physical Exam Vitals reviewed.  Constitutional:      Appearance: Normal appearance. She is normal weight.  Neck:     Vascular: No carotid bruit.  Cardiovascular:     Rate and Rhythm: Normal rate and regular rhythm.     Heart sounds: Normal heart sounds.  Pulmonary:     Effort: Pulmonary effort is normal. No respiratory distress.     Breath sounds: Normal breath sounds.  Abdominal:     General: Abdomen is flat. Bowel sounds are normal.     Palpations: Abdomen is soft.     Tenderness: There is no abdominal tenderness.  Neurological:     Mental Status: She is alert and oriented to person, place, and time.  Psychiatric:        Mood and Affect: Mood normal.        Behavior: Behavior normal.     Diabetic Foot Exam - Simple   No data filed      Lab Results  Component Value Date   WBC 5.5 03/15/2023   HGB 14.4 03/15/2023   HCT 43.7 03/15/2023   PLT 313 03/15/2023   GLUCOSE 102 (H) 03/15/2023   CHOL 203 (H) 03/15/2023   TRIG 109 03/15/2023   HDL 60 03/15/2023   LDLCALC 124 (H) 03/15/2023   ALT 20 03/15/2023   AST 17 03/15/2023   NA 141 03/15/2023   K 5.3 (H) 03/15/2023   CL 101 03/15/2023   CREATININE 0.66 03/15/2023   BUN 16 03/15/2023   CO2 25 03/15/2023   TSH 2.800 03/15/2023   HGBA1C 6.2 (H) 03/15/2023      Assessment & Plan:     Attention deficit hyperactivity disorder (ADHD), predominantly inattentive type Assessment & Plan: Well-controlled.  Continue Adderall 20 mg twice daily.   Moderate persistent asthma without complication Assessment & Plan: Continue Trelegy 1 inhalation daily.  Continue Singulair 10 mg once daily.  Keep albuterol HFA with her.  Orders: -     Trelegy Ellipta; Inhale 1 puff by mouth once daily,  Dispense: 180 each; Refill: 1  Gastroesophageal reflux disease, unspecified whether esophagitis present Assessment & Plan: The current medical regimen is effective;  continue present plan and medications.     Mixed hyperlipidemia Assessment & Plan: Well controlled.  No changes to medicines. Zetia 10 mg daily Continue to work on eating a healthy diet and exercise.  Labs drawn today.    Orders: -     Lipid panel -     TSH  Primary hypertension Assessment & Plan: Well controlled.  No changes to medicines. Benicar 5 mg daily.  Continue to work on eating a healthy diet and exercise.  Labs drawn today.     Orders: -     Comprehensive metabolic panel  Impaired fasting glucose Assessment & Plan: Continue healthy diet and exercise.      Orders: -     CBC with Differential/Platelet -     Hemoglobin A1c  Encounter for immunization -     Influenza, MDCK, trivalent, PF(Flucelvax egg-free)  Rib  pain on right side Assessment & Plan: Order right ribs Xray.  Orders: -     DG Ribs Unilateral Right; Future  Atypical chest pain Assessment & Plan: Order Chest Xray.  Orders: -     DG Chest 2 View; Future  Absence of both cervix and uterus, acquired     Meds ordered this encounter  Medications   Fluticasone-Umeclidin-Vilant (TRELEGY ELLIPTA) 100-62.5-25 MCG/ACT AEPB    Sig: Inhale 1 puff by mouth once daily,    Dispense:  180 each    Refill:  1    Orders Placed This Encounter  Procedures   DG Ribs Unilateral Right   DG Chest 2 View   Influenza, MDCK, trivalent,  PF(Flucelvax egg-free)   CBC with Differential/Platelet   Comprehensive metabolic panel   Lipid panel   Hemoglobin A1c   TSH     Follow-up: Return in about 3 months (around 06/15/2023) for chronic fasting.   I,Katherina A Bramblett,acting as a scribe for Blane Ohara, MD.,have documented all relevant documentation on the behalf of Blane Ohara, MD,as directed by  Blane Ohara, MD while in the presence of Blane Ohara, MD.   Clayborn Bigness I Leal-Borjas,acting as a scribe for Blane Ohara, MD.,have documented all relevant documentation on the behalf of Blane Ohara, MD,as directed by  Blane Ohara, MD while in the presence of Blane Ohara, MD.    An After Visit Summary was printed and given to the patient.  Blane Ohara, MD [facility] [phone]

## 2023-03-16 LAB — TSH: TSH: 2.8 u[IU]/mL (ref 0.450–4.500)

## 2023-03-16 LAB — CBC WITH DIFFERENTIAL/PLATELET
Basophils Absolute: 0.1 10*3/uL (ref 0.0–0.2)
Basos: 1 %
EOS (ABSOLUTE): 0.3 10*3/uL (ref 0.0–0.4)
Eos: 6 %
Hematocrit: 43.7 % (ref 34.0–46.6)
Hemoglobin: 14.4 g/dL (ref 11.1–15.9)
Immature Grans (Abs): 0 10*3/uL (ref 0.0–0.1)
Immature Granulocytes: 0 %
Lymphocytes Absolute: 2.3 10*3/uL (ref 0.7–3.1)
Lymphs: 42 %
MCH: 30.2 pg (ref 26.6–33.0)
MCHC: 33 g/dL (ref 31.5–35.7)
MCV: 92 fL (ref 79–97)
Monocytes Absolute: 0.5 10*3/uL (ref 0.1–0.9)
Monocytes: 8 %
Neutrophils Absolute: 2.4 10*3/uL (ref 1.4–7.0)
Neutrophils: 43 %
Platelets: 313 10*3/uL (ref 150–450)
RBC: 4.77 x10E6/uL (ref 3.77–5.28)
RDW: 13.4 % (ref 11.7–15.4)
WBC: 5.5 10*3/uL (ref 3.4–10.8)

## 2023-03-16 LAB — COMPREHENSIVE METABOLIC PANEL
ALT: 20 [IU]/L (ref 0–32)
AST: 17 [IU]/L (ref 0–40)
Albumin: 4.4 g/dL (ref 3.9–4.9)
Alkaline Phosphatase: 94 [IU]/L (ref 44–121)
BUN/Creatinine Ratio: 24 (ref 12–28)
BUN: 16 mg/dL (ref 8–27)
Bilirubin Total: <0.2 mg/dL (ref 0.0–1.2)
CO2: 25 mmol/L (ref 20–29)
Calcium: 9.4 mg/dL (ref 8.7–10.3)
Chloride: 101 mmol/L (ref 96–106)
Creatinine, Ser: 0.66 mg/dL (ref 0.57–1.00)
Globulin, Total: 2.5 g/dL (ref 1.5–4.5)
Glucose: 102 mg/dL — ABNORMAL HIGH (ref 70–99)
Potassium: 5.3 mmol/L — ABNORMAL HIGH (ref 3.5–5.2)
Sodium: 141 mmol/L (ref 134–144)
Total Protein: 6.9 g/dL (ref 6.0–8.5)
eGFR: 99 mL/min/{1.73_m2} (ref >59)

## 2023-03-16 LAB — LIPID PANEL
Chol/HDL Ratio: 3.4 {ratio} (ref 0.0–4.4)
Cholesterol, Total: 203 mg/dL — ABNORMAL HIGH (ref 100–199)
HDL: 60 mg/dL (ref >39)
LDL Chol Calc (NIH): 124 mg/dL — ABNORMAL HIGH (ref 0–99)
Triglycerides: 109 mg/dL (ref 0–149)
VLDL Cholesterol Cal: 19 mg/dL (ref 5–40)

## 2023-03-16 LAB — HEMOGLOBIN A1C
Est. average glucose Bld gHb Est-mCnc: 131 mg/dL
Hgb A1c MFr Bld: 6.2 % — ABNORMAL HIGH (ref 4.8–5.6)

## 2023-03-17 ENCOUNTER — Encounter: Payer: Self-pay | Admitting: Family Medicine

## 2023-03-17 NOTE — Assessment & Plan Note (Signed)
Well controlled.  No changes to medicines. Benicar 5 mg daily.  Continue to work on eating a healthy diet and exercise.  Labs drawn today.

## 2023-03-17 NOTE — Assessment & Plan Note (Signed)
The current medical regimen is effective;  continue present plan and medications.  

## 2023-03-17 NOTE — Assessment & Plan Note (Signed)
Well controlled.  No changes to medicines. Zetia 10 mg daily. Continue to work on eating a healthy diet and exercise.  Labs drawn today.

## 2023-03-17 NOTE — Assessment & Plan Note (Signed)
Order Chest Xray.

## 2023-03-17 NOTE — Assessment & Plan Note (Signed)
Continue healthy diet and exercise.

## 2023-03-17 NOTE — Assessment & Plan Note (Signed)
Continue Trelegy 1 inhalation daily.  Continue Singulair 10 mg once daily.  Keep albuterol HFA with her.

## 2023-03-17 NOTE — Assessment & Plan Note (Signed)
Well-controlled.  Continue Adderall 20 mg twice daily.

## 2023-03-17 NOTE — Assessment & Plan Note (Addendum)
Order right ribs Xray.

## 2023-03-18 ENCOUNTER — Encounter: Payer: Self-pay | Admitting: Family Medicine

## 2023-03-27 ENCOUNTER — Other Ambulatory Visit: Payer: Self-pay

## 2023-03-27 DIAGNOSIS — F900 Attention-deficit hyperactivity disorder, predominantly inattentive type: Secondary | ICD-10-CM

## 2023-03-27 MED ORDER — AMPHETAMINE-DEXTROAMPHETAMINE 20 MG PO TABS: 20.0000 mg | ORAL_TABLET | Freq: Two times a day (BID) | ORAL | 0 refills | Status: DC

## 2023-03-28 ENCOUNTER — Other Ambulatory Visit: Payer: Self-pay | Admitting: Family Medicine

## 2023-03-28 DIAGNOSIS — J4540 Moderate persistent asthma, uncomplicated: Secondary | ICD-10-CM

## 2023-03-29 ENCOUNTER — Encounter: Payer: Self-pay | Admitting: Family Medicine

## 2023-04-04 ENCOUNTER — Other Ambulatory Visit: Payer: Self-pay | Admitting: Family Medicine

## 2023-04-23 ENCOUNTER — Other Ambulatory Visit: Payer: Self-pay

## 2023-04-27 ENCOUNTER — Other Ambulatory Visit: Payer: Self-pay | Admitting: Family Medicine

## 2023-04-27 DIAGNOSIS — F900 Attention-deficit hyperactivity disorder, predominantly inattentive type: Secondary | ICD-10-CM

## 2023-04-27 NOTE — Telephone Encounter (Signed)
  Chief Complaint: Med Refill   Disposition: [] ED /[] Urgent Care (no appt availability in office) / [] Appointment(In office/virtual)/ []  [facility]/ [] Home Care/ [] Refused Recommended Disposition /[] [facility]/ []  Follow-up with PCP Additional Notes: Patient called in requesting refill on Adderall prescription.  Currently 0 refills.   Copied from CRM [id number]. Topic: Clinical - Medication Refill >> Apr 27, 2023  1:29 PM [name] J wrote: Most Recent Primary Care Visit:  Provider: [facility]  Department: [facility]  Visit Type: OFFICE VISIT  Date: 03/15/2023  Medication: Patient is requesting a refill for amphetamine-dextroamphetamine (ADDERALL) 20 MG  Has the patient contacted their pharmacy? Yes (Agent: If no, request that the patient contact the pharmacy for the refill. If patient does not wish to contact the pharmacy document the reason why and proceed with request.) (Agent: If yes, when and what did the pharmacy advise?) Call provider for an authorization   Is this the correct pharmacy for this prescription? Yes If no, delete pharmacy and type the correct one.  This is the patient's preferred pharmacy:  [facility] [address], [place] - [address] [place] [address] Phone: [phone] Fax: [phone]  [facility] [place], [place] - [address] [place] [address] Phone: [phone] Fax: [phone]   Has the prescription been filled recently? No  Is the patient out of the medication? Yes  Has the patient been seen for an appointment in the last year OR does the patient have an upcoming appointment? Yes  Can we respond through MyChart? Yes  Agent: Please be advised that Rx refills may take up to 3 business days. We ask that you follow-up with your pharmacy.

## 2023-04-29 MED ORDER — AMPHETAMINE-DEXTROAMPHETAMINE 20 MG PO TABS: 20.0000 mg | ORAL_TABLET | Freq: Two times a day (BID) | ORAL | 0 refills | Status: DC

## 2023-04-30 ENCOUNTER — Other Ambulatory Visit: Payer: Self-pay | Admitting: Family Medicine

## 2023-04-30 ENCOUNTER — Other Ambulatory Visit: Payer: Self-pay

## 2023-04-30 DIAGNOSIS — F900 Attention-deficit hyperactivity disorder, predominantly inattentive type: Secondary | ICD-10-CM

## 2023-04-30 MED ORDER — AMPHETAMINE-DEXTROAMPHETAMINE 20 MG PO TABS: 20.0000 mg | ORAL_TABLET | Freq: Two times a day (BID) | ORAL | 0 refills | Status: DC

## 2023-05-14 ENCOUNTER — Other Ambulatory Visit: Payer: Self-pay

## 2023-05-14 DIAGNOSIS — I10 Essential (primary) hypertension: Secondary | ICD-10-CM

## 2023-06-07 ENCOUNTER — Encounter: Payer: Self-pay | Admitting: Family Medicine

## 2023-06-07 ENCOUNTER — Ambulatory Visit: Payer: Self-pay | Admitting: Family Medicine

## 2023-06-07 VITALS — BP 110/80 | HR 64 | Temp 97.2°F | Resp 18 | Ht 67.0 in | Wt 176.8 lb

## 2023-06-07 DIAGNOSIS — R051 Acute cough: Secondary | ICD-10-CM

## 2023-06-07 DIAGNOSIS — J4541 Moderate persistent asthma with (acute) exacerbation: Secondary | ICD-10-CM

## 2023-06-07 DIAGNOSIS — J069 Acute upper respiratory infection, unspecified: Secondary | ICD-10-CM | POA: Diagnosis not present

## 2023-06-07 DIAGNOSIS — J309 Allergic rhinitis, unspecified: Secondary | ICD-10-CM

## 2023-06-07 LAB — POC COVID19 BINAXNOW: SARS Coronavirus 2 Ag: NEGATIVE

## 2023-06-07 LAB — POCT INFLUENZA A/B
Influenza A, POC: NEGATIVE
Influenza B, POC: NEGATIVE

## 2023-06-07 MED ORDER — CEFTRIAXONE SODIUM 1 G IJ SOLR
1.0000 g | Freq: Once | INTRAMUSCULAR | Status: AC
  Administered 2023-06-07: 1 g via INTRAMUSCULAR

## 2023-06-07 MED ORDER — ALBUTEROL SULFATE HFA 108 (90 BASE) MCG/ACT IN AERS: 1.0000 | INHALATION_SPRAY | Freq: Four times a day (QID) | RESPIRATORY_TRACT | 6 refills | Status: DC | PRN

## 2023-06-07 MED ORDER — HYDROCODONE BIT-HOMATROP MBR 5-1.5 MG/5ML PO SOLN
5.0000 mL | Freq: Three times a day (TID) | ORAL | 0 refills | Status: DC | PRN
Start: 2023-06-07 — End: 2023-06-13

## 2023-06-07 MED ORDER — ALBUTEROL SULFATE (2.5 MG/3ML) 0.083% IN NEBU: 2.5000 mg | INHALATION_SOLUTION | Freq: Four times a day (QID) | RESPIRATORY_TRACT | 3 refills | Status: DC | PRN

## 2023-06-07 MED ORDER — TRIAMCINOLONE ACETONIDE 40 MG/ML IJ SUSP
60.0000 mg | Freq: Once | INTRAMUSCULAR | Status: AC
  Administered 2023-06-07: 60 mg via INTRAMUSCULAR

## 2023-06-07 MED ORDER — FLUTICASONE PROPIONATE 50 MCG/ACT NA SUSP: 2.0000 | Freq: Every day | NASAL | 6 refills | Status: AC

## 2023-06-07 NOTE — Assessment & Plan Note (Addendum)
 Severe body aches, chest pain, wheezing, and difficulty breathing. Increased use of albuterol nebulizer without significant relief. Negative for COVID-19, flu, and strep.  - Rocephin shot given

## 2023-06-07 NOTE — Progress Notes (Signed)
 Acute Office Visit  Subjective:    Patient ID: Brittany Walton, female    DOB: 1960/09/26, 63 y.o.   MRN: [id number]  Chief Complaint  Patient presents with   Cough   Nasal Congestion    Discussed the use of AI scribe software for clinical note transcription with the patient, who gave verbal consent to proceed.   HPI: The patient, with a history of asthma and pneumonia, presents with severe body aches, chest pain, and difficulty breathing. The patient describes the chest pain as if somebody's sitting on my chest, and has been unable to get a deep breath in. The patient has been using albuterol  and a nebulizer every six hours for years, but reports that these treatments have not been effective in alleviating the current symptoms. The patient has also been taking over-the-counter guaifenesin, but this has not provided relief. The patient reports a history of smoking in their teenage years, but has not smoked in many years. The patient also mentions a history of a muscle tear in the rib cage, which occurred while bending over a car console. The patient's symptoms have worsened to the point where they are unable to get out of bed due to the severity of the body aches.  Upper respiratory symptoms She complains of congestion, low grade fever, nasal congestion, productive cough with  brown colored sputum, shortness of breath, and wheezing.with fever to Unknown degrees Fahrenheit. Onset of symptoms was a few days ago and worsening.She is drinking plenty of fluids.  Past history is significant for asthma and bronchiectasis. Patient is former smoker, quit  years ago   Past Medical History:  Diagnosis Date   ADHD    Asthma    Biliary dyskinesia    Depression    GERD (gastroesophageal reflux disease)    Hyperlipidemia    Hypertension    Impaired fasting glucose    Lobar pneumonia (HCC) 09/23/2022   Obesity     Past Surgical History:  Procedure Laterality Date   ABDOMINAL HYSTERECTOMY  2006    patrial for uterine prolapse   HERNIA REPAIR  2009   umbilical, bilateral inguinal    Family History  Problem Relation Age of Onset   Heart attack Mother    Dementia Mother    Breast cancer Neg Hx     Social History   Socioeconomic History   Marital status: Married    Spouse name: Not on file   Number of children: 2   Years of education: Not on file   Highest education level: Bachelor's degree (e.g., BA, AB, BS)  Occupational History   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 She typically consumes wine.   Drug use: Never   Sexual activity: Not on file  Other Topics Concern   Not on file  Social History Narrative   Not on file   Social Drivers of Health   Financial Resource Strain: Low Risk  (11/24/2022)   Overall Financial Resource Strain (CARDIA)    Difficulty of Paying Living Expenses: Not very hard  Food Insecurity: No Food Insecurity (11/24/2022)   Hunger Vital Sign    Worried About Running Out of Food in the Last Year: Never true    Ran Out of Food in the Last Year: Never true  Transportation Needs: No Transportation Needs (11/24/2022)   PRAPARE - Administrator, Civil Service (Medical): No    Lack of Transportation (Non-Medical): No  Physical Activity: Insufficiently Active (11/28/2022)   Exercise Vital Sign    Days of Exercise per Week: 3 days    Minutes of Exercise per Session: 30 min  Stress: No Stress Concern Present (11/24/2022)   [employer] of Occupational Health - Occupational Stress Questionnaire    Feeling of Stress : Only a little  Social Connections: Socially Integrated (03/15/2023)   Social Connection and Isolation Panel [NHANES]    Frequency of Communication with Friends and Family: More than three times a week     Frequency of Social Gatherings with Friends and Family: Once a week    Attends Religious Services: More than 4 times per year    Active Member of [employer] or Organizations: Yes    Attends Banker Meetings: 1 to 4 times per year    Marital Status: Married  Catering Manager Violence: Not At Risk (03/15/2023)   Humiliation, Afraid, Rape, and Kick questionnaire    Fear of Current or Ex-Partner: No    Emotionally Abused: No    Physically Abused: No    Sexually Abused: No    Outpatient Medications Prior to Visit  Medication Sig Dispense Refill   amphetamine -dextroamphetamine  (ADDERALL) 20 MG tablet Take 1 tablet (20 mg total) by mouth 2 (two) times daily. 60 tablet 0   ezetimibe  (ZETIA ) 10 MG tablet TAKE 1 TABLET BY MOUTH EVERY DAY 90 tablet 1   Fluticasone -Umeclidin-Vilant (TRELEGY ELLIPTA ) 100-62.5-25 MCG/ACT AEPB Inhale 1 puff by mouth once daily, 180 each 1   ibuprofen  (ADVIL ) 800 MG tablet TAKE 1 TABLET BY MOUTH EVERY 8 HOURS AS NEEDED 90 tablet 1   montelukast  (SINGULAIR ) 10 MG tablet TAKE 1 TABLET BY MOUTH EVERYDAY AT BEDTIME 90 tablet 3   olmesartan  (BENICAR ) 5 MG tablet TAKE 1 TABLET (5 MG TOTAL) BY MOUTH DAILY. 90 tablet 0   omeprazole  (PRILOSEC) 40 MG capsule Take 1 capsule (40 mg total) by mouth 2 (two) times daily. (Patient taking differently: Take 40 mg by mouth daily.) 180 capsule 3   sertraline  (ZOLOFT ) 50 MG tablet TAKE 1 TABLET BY MOUTH EVERY DAY 90 tablet 1   albuterol  (PROVENTIL ) (2.5 MG/3ML) 0.083% nebulizer solution Take 3 mLs (2.5 mg total) by nebulization every 6 (six) hours as needed for wheezing or shortness of breath. 75 mL 3   albuterol  (VENTOLIN  HFA) 108 (90 Base) MCG/ACT inhaler Inhale 1-2 puffs into the lungs every 6 (six) hours as needed for wheezing or shortness of breath. 1 each 6   fluticasone  (FLONASE ) 50 MCG/ACT nasal spray Place 2 sprays into both nostrils daily. 16 g 6   No facility-administered medications prior to visit.    Allergies   Allergen Reactions   Atorvastatin Other (See Comments)    Muscle cramps   Codeine    Crestor [Rosuvastatin] Other (See Comments)    Myalgias   Diphenhydramine Hcl    Elemental Sulfur     Review of Systems  Constitutional:  Positive for fever. Negative for chills, diaphoresis and fatigue.  HENT:  Positive for congestion, rhinorrhea, sore throat and voice change. Negative for ear pain and sinus pain.   Respiratory:  Positive for cough (productive - brown colored sputum), shortness of breath and wheezing.   Cardiovascular:  Positive for chest pain.  Gastrointestinal:  Negative for abdominal pain, constipation, nausea and vomiting.  Genitourinary:  Negative for dysuria.  Musculoskeletal:  Positive for myalgias (body aches). Negative for arthralgias.  Neurological:  Negative for weakness and headaches.  Psychiatric/Behavioral:  Negative for dysphoric mood. The patient is not nervous/anxious.        Objective:        06/07/2023    5:01 PM 03/15/2023   10:27 AM 11/28/2022   10:16 AM  Vitals with BMI  Height 5' 7 5' 7 5' 7  Weight 176 lbs 13 oz 172 lbs 165 lbs  BMI 27.68 26.93 25.84  Systolic 110 112 879  Diastolic 80 64 72  Pulse 64 67 72    Orthostatic VS for the past 72 hrs (Last 3 readings):  Patient Position BP Location Cuff Size  06/07/23 1701 Sitting Left Arm Large     Physical Exam Constitutional:      General: She is not in acute distress.    Appearance: She is ill-appearing.  HENT:     Right Ear: There is impacted cerumen.     Left Ear: There is impacted cerumen.     Nose: Rhinorrhea present.     Mouth/Throat:     Pharynx: Posterior oropharyngeal erythema present.  Cardiovascular:     Rate and Rhythm: Regular rhythm.     Heart sounds: Normal heart sounds.  Pulmonary:     Breath sounds: Decreased air movement present. Decreased breath sounds and wheezing present.  Neurological:     General: No focal deficit present.     Mental Status: She is alert and  oriented to person, place, and time.  Psychiatric:        Behavior: Behavior normal.     Health Maintenance Due  Topic Date Due   HIV Screening  Never done   Zoster Vaccines- Shingrix  (1 of 2) Never done   DTaP/Tdap/Td (2 - Td or Tdap) 02/06/2021   COVID-19 Vaccine (5 - 2024-25 season) 02/04/2023    There are no preventive care reminders to display for this patient.   Lab Results  Component Value Date   TSH 2.800 03/15/2023   Lab Results  Component Value Date   WBC 5.5 03/15/2023   HGB 14.4 03/15/2023   HCT 43.7 03/15/2023   MCV 92 03/15/2023   PLT 313 03/15/2023   Lab Results  Component Value Date   NA 141 03/15/2023   K 5.3 (H) 03/15/2023   CO2 25 03/15/2023   GLUCOSE 102 (H) 03/15/2023   BUN 16 03/15/2023   CREATININE 0.66 03/15/2023   BILITOT <0.2 03/15/2023   ALKPHOS 94 03/15/2023   AST 17 03/15/2023   ALT 20 03/15/2023   PROT 6.9 03/15/2023   ALBUMIN 4.4 03/15/2023   CALCIUM 9.4 03/15/2023   EGFR 99 03/15/2023   Lab Results  Component Value Date   CHOL 203 (H) 03/15/2023   Lab Results  Component Value Date   HDL 60 03/15/2023   Lab Results  Component Value Date   LDLCALC 124 (H) 03/15/2023   Lab Results  Component Value Date   TRIG 109 03/15/2023   Lab Results  Component Value Date   CHOLHDL 3.4 03/15/2023   Lab Results  Component Value Date   HGBA1C 6.2 (H) 03/15/2023       Assessment & Plan:  Acute upper respiratory infection Assessment & Plan: Severe body aches, chest pain, wheezing, and difficulty breathing. Increased use of albuterol  nebulizer without significant relief. Negative for COVID-19, flu, and strep.  - Rocephin  shot given   Orders: -  cefTRIAXone  Sodium  Chronic allergic rhinitis Assessment & Plan: Refill sent for Flonase    Orders: -     Fluticasone  Propionate; Place 2 sprays into both nostrils daily.  Dispense: 16 g; Refill: 6  Moderate persistent asthma with exacerbation Assessment & Plan: Concern for  possible pneumonia or pulmonary complications given the severity of symptoms and history. -Order stat chest X-ray to rule out pneumonia and other pulmonary complications. -Administer Kenalog  and Rocephin  shots in office. -If chest X-ray positive for pneumonia, prescribe Z-Pak. -Reorder albuterol  nebulizer treatment and Flonase . -Advised to go to ED for symptoms of respiratory distress  Orders: -     DG Chest 2 View; Future -     Albuterol  Sulfate HFA; Inhale 1-2 puffs into the lungs every 6 (six) hours as needed for wheezing or shortness of breath.  Dispense: 1 each; Refill: 6 -     Albuterol  Sulfate; Take 3 mLs (2.5 mg total) by nebulization every 6 (six) hours as needed for wheezing or shortness of breath.  Dispense: 75 mL; Refill: 3 -     Triamcinolone  Acetonide  Acute cough Assessment & Plan: Acute Flu and Covid - negative Increase fluid intake, rest and practice good hand hygeine Throw away your toothbrush and start using a new one Try lemon and honey and/or cough drops for cough  -Cough medication sent to pharmacy  Orders: -     POCT Influenza A/B -     POC COVID-19 BinaxNow -     HYDROcodone  Bit-Homatrop MBr; Take 5 mLs by mouth every 8 (eight) hours as needed for cough.  Dispense: 120 mL; Refill: 0     Meds ordered this encounter  Medications   albuterol  (VENTOLIN  HFA) 108 (90 Base) MCG/ACT inhaler    Sig: Inhale 1-2 puffs into the lungs every 6 (six) hours as needed for wheezing or shortness of breath.    Dispense:  1 each    Refill:  6   albuterol  (PROVENTIL ) (2.5 MG/3ML) 0.083% nebulizer solution    Sig: Take 3 mLs (2.5 mg total) by nebulization every 6 (six) hours as needed for wheezing or shortness of breath.    Dispense:  75 mL    Refill:  3   fluticasone  (FLONASE ) 50 MCG/ACT nasal spray    Sig: Place 2 sprays into both nostrils daily.    Dispense:  16 g    Refill:  6   HYDROcodone  bit-homatropine (HYCODAN) 5-1.5 MG/5ML syrup    Sig: Take 5 mLs by mouth every  8 (eight) hours as needed for cough.    Dispense:  120 mL    Refill:  0   cefTRIAXone  (ROCEPHIN ) injection 1 g   triamcinolone  acetonide (KENALOG -40) injection 60 mg    Orders Placed This Encounter  Procedures   DG Chest 2 View   POCT Influenza A/B   POC COVID-19 BinaxNow     Follow-up: Return if symptoms worsen or fail to improve.  An After Visit Summary was printed and given to the patient.  Total time spent on today's visit was 34 minutes, including both face-to-face time and nonface-to-face time personally spent on review of chart (labs and imaging), discussing labs and goals, discussing further work-up, treatment options, referrals to specialist if needed, reviewing outside records if pertinent, answering patient's questions, and coordinating care.    Harrie Cedar, FNP [facility] [phone]

## 2023-06-07 NOTE — Assessment & Plan Note (Addendum)
 Acute Flu and Covid - negative Increase fluid intake, rest and practice good hand hygeine Throw away your toothbrush and start using a new one Try lemon and honey and/or cough drops for cough  -Cough medication sent to pharmacy

## 2023-06-07 NOTE — Assessment & Plan Note (Deleted)
 Continue Trelegy 1 inhalation daily.  Continue Singulair 10 mg once daily.  Keep albuterol HFA with her. - refills given for albuterol nebulizer and inhaler.

## 2023-06-07 NOTE — Assessment & Plan Note (Signed)
Refill sent for Flonase.

## 2023-06-07 NOTE — Assessment & Plan Note (Addendum)
 Concern for possible pneumonia or pulmonary complications given the severity of symptoms and history. -Order stat chest X-ray to rule out pneumonia and other pulmonary complications. -Administer Kenalog  and Rocephin  shots in office. -If chest X-ray positive for pneumonia, prescribe Z-Pak. -Reorder albuterol  nebulizer treatment and Flonase . -Advised to go to ED for symptoms of respiratory distress

## 2023-06-08 ENCOUNTER — Encounter: Payer: Self-pay | Admitting: Family Medicine

## 2023-06-08 ENCOUNTER — Other Ambulatory Visit: Payer: Self-pay | Admitting: Family Medicine

## 2023-06-08 DIAGNOSIS — J189 Pneumonia, unspecified organism: Secondary | ICD-10-CM

## 2023-06-08 MED ORDER — AZITHROMYCIN 250 MG PO TABS: ORAL_TABLET | ORAL | 0 refills | Status: DC

## 2023-06-12 ENCOUNTER — Ambulatory Visit: Payer: Self-pay | Admitting: Family Medicine

## 2023-06-12 NOTE — Telephone Encounter (Signed)
 Copied from CRM [id number]. Topic: Clinical - Red Word Triage >> Jun 12, 2023  4:19 PM Chase C wrote: [facility] that prompted transfer to Nurse Triage: Patient calling in stating that she has had pneumonia and has finished the z-pack medication but is still coughing brown colored fluids. Transferred to Nurse Triage Line.  Chief Complaint: Productive cough Symptoms: Productive cough and wheezing Frequency: Almost 2 weeks Pertinent Negatives: Patient denies fever and SOB Disposition: [] ED /[] Urgent Care (no appt availability in office) / [x] Appointment(In office/virtual)/ []  [facility]/ [] Home Care/ [] Refused Recommended Disposition /[] [facility]/ []  Follow-up with PCP Additional Notes: Patient called in complaining of productive cough. Patient was diagnosed with pneumonia and started a Z-pak last Thursday. Patient stated she is feeling better overall, but the cough is persistent and productive. Patient reported coughing up brown phlegm. Patient also reports that wheezing, congestion, and chest pain when coughing are still present. Patient denies fever and SOB at this time. Patient stated that she has been prescribed a steroid in addition to an antibiotic in the past for pneumonia and no steroid was prescribed this time. Patient finished the Z-pak today and is seeking additional medication in order for the pneumonia to go away completely. Patient reports that she has been using an albuterol  inhaler and cough syrup for additional relief. Scheduled patient an office visit for tomorrow afternoon. Advised patient to continue home remedies and call back if symptoms worsen.   Reason for Disposition  [1] Finished taking antibiotics AND [2] symptoms are BETTER but [3] not completely gone  Answer Assessment - Initial Assessment Questions 1. ONSET: When did the cough begin?      December 28th   2. SEVERITY: How bad is the cough today?      Patient states cough is still pretty bad  and constant  3. SPUTUM: Describe the color of your sputum (none, dry cough; clear, white, yellow, green)     Brown  4. HEMOPTYSIS: Are you coughing up any blood? If so ask: How much? (flecks, streaks, tablespoons, etc.)     Patient denies  5. DIFFICULTY BREATHING: Are you having difficulty breathing? If Yes, ask: How bad is it? (e.g., mild, moderate, severe)    - MILD: No SOB at rest, mild SOB with walking, speaks normally in sentences, can lie down, no retractions, pulse < 100.    - MODERATE: SOB at rest, SOB with minimal exertion and prefers to sit, cannot lie down flat, speaks in phrases, mild retractions, audible wheezing, pulse 100-120.    - SEVERE: Very SOB at rest, speaks in single words, struggling to breathe, sitting hunched forward, retractions, pulse > 120      Patient denies SOB at this time  6. FEVER: Do you have a fever? If Yes, ask: What is your temperature, how was it measured, and when did it start?     Patient states her fever has gone away  7. CARDIAC HISTORY: Do you have any history of heart disease? (e.g., heart attack, congestive heart failure)      Denies  8. LUNG HISTORY: Do you have any history of lung disease?  (e.g., pulmonary embolus, asthma, emphysema)     Patient states she has recurrent pneumonia and a history of asthma  10. OTHER SYMPTOMS: Do you have any other symptoms? (e.g., runny nose, wheezing, chest pain)       Wheezing, runny nose, congestion and chest pain when coughing  Answer Assessment - Initial Assessment Questions 1. INFECTION:  What infection is the antibiotic being given for?     Pneumonia  2. ANTIBIOTIC: What antibiotic are you taking How many times per day?     Patient states Z-pak was started last Thursday  3. DURATION: When was the antibiotic started?     Patient states antibiotic was started last Thursday  4. MAIN CONCERN OR SYMPTOM:  What is your main concern right now?     Productive cough  5.  BETTER-SAME-WORSE: Are you getting better, staying the same, or getting worse compared to when you first started the antibiotics? If getting worse, ask: In what way?      Patient states she is feeling better, but the cough has not gone away  6. FEVER: Do you have a fever? If Yes, ask: What is your temperature, how was it measured, and when did it start?     Patient denies fever at this time  7. SYMPTOMS: Are there any other symptoms you're concerned about? If Yes, ask: When did it start?     Wheezing, runny nose, congestion and chest pain when coughing  8. FOLLOW-UP APPOINTMENT: Do you have a follow-up appointment with your doctor?     Patient denies  Protocols used: Cough - Chronic-A-AH, Infection on Antibiotic Follow-up Call-[facility]

## 2023-06-13 ENCOUNTER — Ambulatory Visit: Payer: 59 | Admitting: Family Medicine

## 2023-06-13 ENCOUNTER — Encounter: Payer: Self-pay | Admitting: Family Medicine

## 2023-06-13 VITALS — BP 102/70 | HR 82 | Temp 98.1°F | Resp 16 | Ht 67.0 in | Wt 171.4 lb

## 2023-06-13 DIAGNOSIS — J189 Pneumonia, unspecified organism: Secondary | ICD-10-CM

## 2023-06-13 MED ORDER — AMOXICILLIN 875 MG PO TABS: 875.0000 mg | ORAL_TABLET | Freq: Two times a day (BID) | ORAL | 0 refills | Status: AC

## 2023-06-13 MED ORDER — PREDNISONE 20 MG PO TABS: ORAL_TABLET | ORAL | 0 refills | Status: AC

## 2023-06-13 MED ORDER — HYDROCODONE BIT-HOMATROP MBR 5-1.5 MG/5ML PO SOLN: 10.0000 mL | Freq: Three times a day (TID) | ORAL | 0 refills | Status: DC | PRN

## 2023-06-13 NOTE — Assessment & Plan Note (Signed)
 Persistent cough and chest tightness despite completion of antibiotics (Z-Pak and Rocephin  shot) and steroids (Kenalog ). No fever. Chest pain on the side opposite to the pneumonia. -Increase cough medicine dose. -Start Amoxicillin . -Continue using inhaler and nebulizer as needed. -Start Prednisone  taper

## 2023-06-13 NOTE — Progress Notes (Signed)
 Acute Office Visit  Subjective:    Patient ID: Brittany Walton, female    DOB: April 19, 1961, 63 y.o.   MRN: [id number]  Chief Complaint  Patient presents with   Pneumonia   Cough     Discussed the use of AI scribe software for clinical note transcription with the patient, who gave verbal consent to proceed.   HPI: Patient was diagnosed with pneumonia on 06/07/2023. She stated that she has completed all medicines that she was prescribed. The patient presents with a persistent cough and chest tightness despite treatment with antibiotics and cough medicine. They report that the cough medicine is not providing significant relief, and they have been using an inhaler and nebulizer as well. They have previously been treated with steroids for similar symptoms, and they are wondering if additional steroids might be beneficial. They have completed a course of antibiotics and have not had a fever recently. They also report some residual nasal congestion.  Past Medical History:  Diagnosis Date   ADHD    Asthma    Biliary dyskinesia    Depression    GERD (gastroesophageal reflux disease)    Hyperlipidemia    Hypertension    Impaired fasting glucose    Lobar pneumonia (HCC) 09/23/2022   Obesity     Past Surgical History:  Procedure Laterality Date   ABDOMINAL HYSTERECTOMY  2006   patrial for uterine prolapse   HERNIA REPAIR  2009   umbilical, bilateral inguinal    Family History  Problem Relation Age of Onset   Heart attack Mother    Dementia Mother    Breast cancer Neg Hx     Social History   Socioeconomic History   Marital status: Married    Spouse name: Not on file   Number of children: 2   Years of education: Not on file   Highest education level: Bachelor's degree (e.g., BA, AB, BS)  Occupational History   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 She typically consumes wine.   Drug use: Never   Sexual activity: Not on file  Other Topics Concern   Not on file  Social History Narrative   Not on file   Social Drivers of Health   Financial Resource Strain: Low Risk  (11/24/2022)   Overall Financial Resource Strain (CARDIA)    Difficulty of Paying Living Expenses: Not very hard  Food Insecurity: No Food Insecurity (11/24/2022)   Hunger Vital Sign    Worried About Running Out of Food in the Last Year: Never true    Ran Out of Food in the Last Year: Never true  Transportation Needs: No Transportation Needs (11/24/2022)   PRAPARE - Administrator, Civil Service (Medical): No    Lack of Transportation (Non-Medical): No  Physical Activity: Insufficiently Active (11/28/2022)   Exercise Vital Sign    Days of Exercise per Week: 3 days    Minutes of Exercise per Session: 30 min  Stress: No Stress Concern Present (11/24/2022)   [employer] of Occupational Health - Occupational Stress Questionnaire    Feeling of Stress : Only a little  Social Connections: Socially Integrated (03/15/2023)   Social Connection and Isolation Panel [NHANES]    Frequency of Communication with Friends and Family: More than  three times a week    Frequency of Social Gatherings with Friends and Family: Once a week    Attends Religious Services: More than 4 times per year    Active Member of [employer] or Organizations: Yes    Attends Banker Meetings: 1 to 4 times per year    Marital Status: Married  Catering Manager Violence: Not At Risk (03/15/2023)   Humiliation, Afraid, Rape, and Kick questionnaire    Fear of Current or Ex-Partner: No    Emotionally Abused: No    Physically Abused: No    Sexually Abused: No    Outpatient Medications Prior to Visit  Medication Sig Dispense Refill   albuterol  (PROVENTIL ) (2.5 MG/3ML) 0.083% nebulizer solution Take  3 mLs (2.5 mg total) by nebulization every 6 (six) hours as needed for wheezing or shortness of breath. 75 mL 3   albuterol  (VENTOLIN  HFA) 108 (90 Base) MCG/ACT inhaler Inhale 1-2 puffs into the lungs every 6 (six) hours as needed for wheezing or shortness of breath. 1 each 6   amphetamine -dextroamphetamine  (ADDERALL) 20 MG tablet Take 1 tablet (20 mg total) by mouth 2 (two) times daily. 60 tablet 0   azithromycin  (ZITHROMAX ) 250 MG tablet 2 DAILY FOR FIRST DAY, THEN DECREASE TO ONE DAILY FOR 4 MORE DAYS. 6 tablet 0   ezetimibe  (ZETIA ) 10 MG tablet TAKE 1 TABLET BY MOUTH EVERY DAY 90 tablet 1   fluticasone  (FLONASE ) 50 MCG/ACT nasal spray Place 2 sprays into both nostrils daily. 16 g 6   Fluticasone -Umeclidin-Vilant (TRELEGY ELLIPTA ) 100-62.5-25 MCG/ACT AEPB Inhale 1 puff by mouth once daily, 180 each 1   ibuprofen  (ADVIL ) 800 MG tablet TAKE 1 TABLET BY MOUTH EVERY 8 HOURS AS NEEDED 90 tablet 1   montelukast  (SINGULAIR ) 10 MG tablet TAKE 1 TABLET BY MOUTH EVERYDAY AT BEDTIME 90 tablet 3   olmesartan  (BENICAR ) 5 MG tablet TAKE 1 TABLET (5 MG TOTAL) BY MOUTH DAILY. 90 tablet 0   omeprazole  (PRILOSEC) 40 MG capsule Take 1 capsule (40 mg total) by mouth 2 (two) times daily. (Patient taking differently: Take 40 mg by mouth daily.) 180 capsule 3   sertraline  (ZOLOFT ) 50 MG tablet TAKE 1 TABLET BY MOUTH EVERY DAY 90 tablet 1   HYDROcodone  bit-homatropine (HYCODAN) 5-1.5 MG/5ML syrup Take 5 mLs by mouth every 8 (eight) hours as needed for cough. 120 mL 0   No facility-administered medications prior to visit.    Allergies  Allergen Reactions   Atorvastatin Other (See Comments)    Muscle cramps   Codeine    Crestor [Rosuvastatin] Other (See Comments)    Myalgias   Diphenhydramine Hcl    Elemental Sulfur     Review of Systems  Constitutional:  Positive for fatigue. Negative for chills, diaphoresis and fever.  HENT:  Positive for congestion. Negative for ear pain and sinus pain.   Respiratory:   Positive for cough, shortness of breath and wheezing.   Cardiovascular:  Negative for chest pain.  Gastrointestinal:  Negative for abdominal pain, constipation, nausea and vomiting.  Genitourinary:  Negative for dysuria.  Musculoskeletal:  Negative for arthralgias.  Neurological:  Negative for weakness and headaches.  Psychiatric/Behavioral:  Negative for dysphoric mood. The patient is not nervous/anxious.        Objective:        06/13/2023    1:25 PM 06/07/2023    5:01 PM 03/15/2023   10:27 AM  Vitals with BMI  Height 5' 7 5' 7 5' 7  Weight  171 lbs 6 oz 176 lbs 13 oz 172 lbs  BMI 26.84 27.68 26.93  Systolic 102 110 887  Diastolic 70 80 64  Pulse 82 64 67    Orthostatic VS for the past 72 hrs (Last 3 readings):  Patient Position BP Location Cuff Size  06/13/23 1325 Sitting Left Arm Large     Physical Exam Constitutional:      General: She is not in acute distress.    Appearance: Normal appearance. She is not ill-appearing.  HENT:     Head: Normocephalic.  Eyes:     Conjunctiva/sclera: Conjunctivae normal.  Neck:     Vascular: No carotid bruit.  Cardiovascular:     Rate and Rhythm: Normal rate and regular rhythm.     Heart sounds: Normal heart sounds. No murmur heard. Pulmonary:     Effort: Pulmonary effort is normal.     Breath sounds: Wheezing and rhonchi present.  Musculoskeletal:        General: Normal range of motion.  Skin:    General: Skin is warm.  Neurological:     Mental Status: She is alert. Mental status is at baseline.  Psychiatric:        Mood and Affect: Mood normal.        Behavior: Behavior normal.     Health Maintenance Due  Topic Date Due   HIV Screening  Never done   Zoster Vaccines- Shingrix  (1 of 2) Never done   DTaP/Tdap/Td (2 - Td or Tdap) 02/06/2021   COVID-19 Vaccine (5 - 2024-25 season) 02/04/2023    There are no preventive care reminders to display for this patient.   Lab Results  Component Value Date   TSH 2.800  03/15/2023   Lab Results  Component Value Date   WBC 5.5 03/15/2023   HGB 14.4 03/15/2023   HCT 43.7 03/15/2023   MCV 92 03/15/2023   PLT 313 03/15/2023   Lab Results  Component Value Date   NA 141 03/15/2023   K 5.3 (H) 03/15/2023   CO2 25 03/15/2023   GLUCOSE 102 (H) 03/15/2023   BUN 16 03/15/2023   CREATININE 0.66 03/15/2023   BILITOT <0.2 03/15/2023   ALKPHOS 94 03/15/2023   AST 17 03/15/2023   ALT 20 03/15/2023   PROT 6.9 03/15/2023   ALBUMIN 4.4 03/15/2023   CALCIUM 9.4 03/15/2023   EGFR 99 03/15/2023   Lab Results  Component Value Date   CHOL 203 (H) 03/15/2023   Lab Results  Component Value Date   HDL 60 03/15/2023   Lab Results  Component Value Date   LDLCALC 124 (H) 03/15/2023   Lab Results  Component Value Date   TRIG 109 03/15/2023   Lab Results  Component Value Date   CHOLHDL 3.4 03/15/2023   Lab Results  Component Value Date   HGBA1C 6.2 (H) 03/15/2023       Assessment & Plan:  Community acquired pneumonia of right middle lobe of lung Assessment & Plan: Persistent cough and chest tightness despite completion of antibiotics (Z-Pak and Rocephin  shot) and steroids (Kenalog ). No fever. Chest pain on the side opposite to the pneumonia. -Increase cough medicine dose. -Start Amoxicillin . -Continue using inhaler and nebulizer as needed. -Start Prednisone  taper  Orders: -     predniSONE ; Take 3 tablets (60 mg total) by mouth daily with breakfast for 3 days, THEN 2 tablets (40 mg total) daily with breakfast for 3 days, THEN 1 tablet (20 mg total) daily with breakfast for 3  days.  Dispense: 18 tablet; Refill: 0 -     HYDROcodone  Bit-Homatrop MBr; Take 10 mLs by mouth every 8 (eight) hours as needed for cough.  Dispense: 120 mL; Refill: 0 -     Amoxicillin ; Take 1 tablet (875 mg total) by mouth 2 (two) times daily for 10 days.  Dispense: 20 tablet; Refill: 0     Meds ordered this encounter  Medications   predniSONE  (DELTASONE ) 20 MG tablet     Sig: Take 3 tablets (60 mg total) by mouth daily with breakfast for 3 days, THEN 2 tablets (40 mg total) daily with breakfast for 3 days, THEN 1 tablet (20 mg total) daily with breakfast for 3 days.    Dispense:  18 tablet    Refill:  0   HYDROcodone  bit-homatropine (HYCODAN) 5-1.5 MG/5ML syrup    Sig: Take 10 mLs by mouth every 8 (eight) hours as needed for cough.    Dispense:  120 mL    Refill:  0   amoxicillin  (AMOXIL ) 875 MG tablet    Sig: Take 1 tablet (875 mg total) by mouth 2 (two) times daily for 10 days.    Dispense:  20 tablet    Refill:  0    No orders of the defined types were placed in this encounter.    Follow-up: Return if symptoms worsen or fail to improve.  An After Visit Summary was printed and given to the patient.  Total time spent on today's visit was 25 minutes, including both face-to-face time and nonface-to-face time personally spent on review of chart (labs and imaging), discussing labs and goals, discussing further work-up, treatment options, referrals to specialist if needed, reviewing outside records if pertinent, answering patient's questions, and coordinating care.    Harrie Cedar, FNP [facility] [phone]

## 2023-06-21 ENCOUNTER — Other Ambulatory Visit: Payer: Self-pay

## 2023-06-28 NOTE — Progress Notes (Unsigned)
Subjective:  Patient ID: Brittany Walton, female    DOB: 28-Aug-1960  Age: 63 y.o. MRN: [id number]  Chief Complaint  Patient presents with   Medical Management of Chronic Issues    HPI  The patient, with a history of asthma, hyperlipidemia, hypertension, and ADHD, presents with a persistent cough and chest congestion. They report that despite completing a course of Z-Pak, prednisone, and penicillin, the cough and chest congestion persist. They also experienced a fever of nearly 100 degrees after completing the course of antibiotics. They describe the cough as intermittent, with occasional production of sputum.  In addition to the cough and chest congestion, the patient reports nasal congestion. They deny any sore throat. They also report a recent weight gain after a period of weight loss, and admit to not adhering strictly to their [employer]  Their ADHD is well controlled on Adderall, and they report no issues with attention. They also report good control of their asthma symptoms with the use of a Trelegy inhaler. Their acid reflux is managed with omeprazole, and they deny any current feelings of depression or anxiety.     06/29/2023   10:40 AM 03/15/2023   10:28 AM 11/28/2022   10:21 AM 05/19/2022   10:38 AM 08/16/2021   10:42 AM  Depression screen PHQ 2/9  Decreased Interest 0 0 0 0 0  Down, Depressed, Hopeless 0 0 0 0 0  PHQ - 2 Score 0 0 0 0 0  Altered sleeping  0 0 0 0  Tired, decreased energy  0 1 0 0  Change in appetite  0 0 0 0  Feeling bad or failure about yourself   0 0 0 0  Trouble concentrating  0 0 0 0  Moving slowly or fidgety/restless  0 0 0 0  Suicidal thoughts  0 0 0 0  PHQ-9 Score  0 1 0 0  Difficult doing work/chores  Not difficult at all Not difficult at all Not difficult at all Not difficult at all        03/15/2023   10:28 AM  Fall Risk   Number falls in past yr: 0  Injury with Fall? 0  Follow up Falls evaluation completed    Patient Care  Team: Blane Ohara, MD as PCP - General (Family Medicine)   Review of Systems  Constitutional:  Negative for chills, fatigue and fever.  HENT:  Negative for congestion, ear pain, rhinorrhea and sore throat.   Respiratory:  Positive for cough. Negative for shortness of breath.   Cardiovascular:  Negative for chest pain.  Gastrointestinal:  Negative for abdominal pain, constipation, diarrhea, nausea and vomiting.  Genitourinary:  Negative for dysuria and urgency.  Musculoskeletal:  Negative for back pain and myalgias.  Neurological:  Negative for dizziness, weakness, light-headedness and headaches.  Psychiatric/Behavioral:  Negative for dysphoric mood. The patient is not nervous/anxious.     Current Outpatient Medications on File Prior to Visit  Medication Sig Dispense Refill   albuterol (PROVENTIL) (2.5 MG/3ML) 0.083% nebulizer solution Take 3 mLs (2.5 mg total) by nebulization every 6 (six) hours as needed for wheezing or shortness of breath. 75 mL 3   albuterol (VENTOLIN HFA) 108 (90 Base) MCG/ACT inhaler Inhale 1-2 puffs into the lungs every 6 (six) hours as needed for wheezing or shortness of breath. 1 each 6   ezetimibe (ZETIA) 10 MG tablet TAKE 1 TABLET BY MOUTH EVERY DAY 90 tablet 1   fluticasone (FLONASE) 50 MCG/ACT nasal spray Place  2 sprays into both nostrils daily. 16 g 6   Fluticasone-Umeclidin-Vilant (TRELEGY ELLIPTA) 100-62.5-25 MCG/ACT AEPB Inhale 1 puff by mouth once daily, 180 each 1   HYDROcodone bit-homatropine (HYCODAN) 5-1.5 MG/5ML syrup Take 10 mLs by mouth every 8 (eight) hours as needed for cough. 120 mL 0   ibuprofen (ADVIL) 800 MG tablet TAKE 1 TABLET BY MOUTH EVERY 8 HOURS AS NEEDED 90 tablet 1   montelukast (SINGULAIR) 10 MG tablet TAKE 1 TABLET BY MOUTH EVERYDAY AT BEDTIME 90 tablet 3   olmesartan (BENICAR) 5 MG tablet TAKE 1 TABLET (5 MG TOTAL) BY MOUTH DAILY. 90 tablet 0   omeprazole (PRILOSEC) 40 MG capsule Take 1 capsule (40 mg total) by mouth 2 (two) times  daily. (Patient taking differently: Take 40 mg by mouth daily.) 180 capsule 3   sertraline (ZOLOFT) 50 MG tablet TAKE 1 TABLET BY MOUTH EVERY DAY 90 tablet 1   No current facility-administered medications on file prior to visit.   Past Medical History:  Diagnosis Date   ADHD    Asthma    Biliary dyskinesia    Depression    GERD (gastroesophageal reflux disease)    Hyperlipidemia    Hypertension    Impaired fasting glucose    Lobar pneumonia (HCC) 09/23/2022   Obesity    Past Surgical History:  Procedure Laterality Date   ABDOMINAL HYSTERECTOMY  2006   patrial for uterine prolapse   HERNIA REPAIR  2009   umbilical, bilateral inguinal    Family History  Problem Relation Age of Onset   Heart attack Mother    Dementia Mother    Breast cancer Neg Hx    Social History   Socioeconomic History   Marital status: Married    Spouse name: Not on file   Number of children: 2   Years of education: Not on file   Highest education level: Bachelor's degree (e.g., BA, AB, BS)  Occupational History   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 She typically consumes wine.   Drug use: Never   Sexual activity: Not on file  Other Topics Concern   Not on file  Social History Narrative   Not on file   Social Drivers of Health   Financial Resource Strain: Low Risk  (11/24/2022)   Overall Financial Resource Strain (CARDIA)    Difficulty of Paying Living Expenses: Not very hard  Food Insecurity: No Food Insecurity (11/24/2022)   Hunger Vital Sign    Worried About Running Out of Food in the Last Year: Never true    Ran Out of Food in the Last Year: Never true  Transportation Needs: No Transportation Needs (11/24/2022)   PRAPARE - Administrator, Civil Service (Medical): No     Lack of Transportation (Non-Medical): No  Physical Activity: Insufficiently Active (11/28/2022)   Exercise Vital Sign    Days of Exercise per Week: 3 days    Minutes of Exercise per Session: 30 min  Stress: No Stress Concern Present (11/24/2022)   [employer] of Occupational Health - Occupational Stress Questionnaire    Feeling of Stress : Only a little  Social Connections: Socially Integrated (03/15/2023)   Social Connection and Isolation Panel [NHANES]    Frequency of Communication with Friends and Family:  More than three times a week    Frequency of Social Gatherings with Friends and Family: Once a week    Attends Religious Services: More than 4 times per year    Active Member of Clubs or Organizations: Yes    Attends Banker Meetings: 1 to 4 times per year    Marital Status: Married    Objective:  BP 136/82   Pulse 73   Temp 97.6 F (36.4 C)   Ht 5\' 7"  (1.702 m)   Wt 171 lb (77.6 kg)   SpO2 99%   BMI 26.78 kg/m      06/29/2023   10:34 AM 06/13/2023    1:25 PM 06/07/2023    5:01 PM  BP/Weight  Systolic BP 136 102 110  Diastolic BP 82 70 80  Wt. (Lbs) 171 171.4 176.8  BMI 26.78 kg/m2 26.85 kg/m2 27.69 kg/m2    Physical Exam Vitals reviewed.  Constitutional:      Appearance: Normal appearance.  HENT:     Right Ear: Tympanic membrane normal.     Left Ear: Tympanic membrane normal.     Nose: No congestion or rhinorrhea.     Mouth/Throat:     Pharynx: No posterior oropharyngeal erythema.  Neck:     Vascular: No carotid bruit.  Cardiovascular:     Rate and Rhythm: Normal rate and regular rhythm.     Heart sounds: Normal heart sounds.  Pulmonary:     Effort: Pulmonary effort is normal. No respiratory distress.     Breath sounds: Normal breath sounds.  Abdominal:     General: Abdomen is flat. Bowel sounds are normal.     Palpations: Abdomen is soft.     Tenderness: There is no abdominal tenderness.  Neurological:     Mental Status: She is  alert and oriented to person, place, and time.  Psychiatric:        Mood and Affect: Mood normal.        Behavior: Behavior normal.     Diabetic Foot Exam - Simple   No data filed      Lab Results  Component Value Date   WBC 5.5 06/29/2023   HGB 14.5 06/29/2023   HCT 44.1 06/29/2023   PLT 292 06/29/2023   GLUCOSE 109 (H) 06/29/2023   CHOL 225 (H) 06/29/2023   TRIG 123 06/29/2023   HDL 69 06/29/2023   LDLCALC 134 (H) 06/29/2023   ALT 24 06/29/2023   AST 18 06/29/2023   NA 137 06/29/2023   K 5.2 06/29/2023   CL 100 06/29/2023   CREATININE 0.69 06/29/2023   BUN 17 06/29/2023   CO2 23 06/29/2023   TSH 2.800 03/15/2023   HGBA1C 6.4 (H) 06/29/2023      Assessment & Plan:    Attention deficit hyperactivity disorder (ADHD), predominantly inattentive type Assessment & Plan: Well-controlled.  Continue Adderall 20 mg twice daily.  Orders: -     Amphetamine-Dextroamphetamine; Take 1 tablet (20 mg total) by mouth 2 (two) times daily.  Dispense: 60 tablet; Refill: 0  Primary hypertension Assessment & Plan: -Check blood pressure after discontinuation of Olmesartan. BP has been very well controlled and she may not need it any more.  Continue to work on eating a healthy diet and exercise.  Labs drawn today.     Orders: -     CBC with Differential/Platelet -     Comprehensive metabolic panel  Mixed hyperlipidemia Assessment & Plan: Well controlled.  No changes to medicines.  Zetia 10 mg daily Continue to work on eating a healthy diet and exercise.  Labs drawn today.    Orders: -     Lipid panel  Gastroesophageal reflux disease, unspecified whether esophagitis present Assessment & Plan: The current medical regimen is effective;  continue present plan and medications.     Impaired fasting glucose Assessment & Plan: Continue healthy diet and exercise.      Orders: -     Hemoglobin A1c  Moderate persistent asthma without complication       Orders  Placed This Encounter  Procedures   CBC with Differential/Platelet   Comprehensive metabolic panel   Hemoglobin A1c   Lipid panel     Follow-up: Return in about 4 months (around 10/27/2023).   I,Marla I Leal-Borjas,acting as a scribe for Blane Ohara, MD.,have documented all relevant documentation on the behalf of Blane Ohara, MD,as directed by  Blane Ohara, MD while in the presence of Blane Ohara, MD.   An After Visit Summary was printed and given to the patient.  I attest that I have reviewed this visit and agree with the plan scribed by my staff.   Blane Ohara, MD [facility] [phone]

## 2023-06-29 ENCOUNTER — Encounter: Payer: Self-pay | Admitting: Family Medicine

## 2023-06-29 ENCOUNTER — Ambulatory Visit: Payer: 59 | Admitting: Family Medicine

## 2023-06-29 VITALS — BP 136/82 | HR 73 | Temp 97.6°F | Ht 67.0 in | Wt 171.0 lb

## 2023-06-29 DIAGNOSIS — E782 Mixed hyperlipidemia: Secondary | ICD-10-CM

## 2023-06-29 DIAGNOSIS — F900 Attention-deficit hyperactivity disorder, predominantly inattentive type: Secondary | ICD-10-CM | POA: Diagnosis not present

## 2023-06-29 DIAGNOSIS — J4540 Moderate persistent asthma, uncomplicated: Secondary | ICD-10-CM

## 2023-06-29 DIAGNOSIS — I10 Essential (primary) hypertension: Secondary | ICD-10-CM

## 2023-06-29 DIAGNOSIS — K219 Gastro-esophageal reflux disease without esophagitis: Secondary | ICD-10-CM | POA: Diagnosis not present

## 2023-06-29 DIAGNOSIS — R7301 Impaired fasting glucose: Secondary | ICD-10-CM

## 2023-06-29 MED ORDER — AMPHETAMINE-DEXTROAMPHETAMINE 20 MG PO TABS
20.0000 mg | ORAL_TABLET | Freq: Two times a day (BID) | ORAL | 0 refills | Status: DC
Start: 2023-06-29 — End: 2023-08-10

## 2023-06-30 ENCOUNTER — Encounter: Payer: Self-pay | Admitting: Family Medicine

## 2023-06-30 LAB — COMPREHENSIVE METABOLIC PANEL
ALT: 24 [IU]/L (ref 0–32)
AST: 18 [IU]/L (ref 0–40)
Albumin: 4.5 g/dL (ref 3.9–4.9)
Alkaline Phosphatase: 87 [IU]/L (ref 44–121)
BUN/Creatinine Ratio: 25 (ref 12–28)
BUN: 17 mg/dL (ref 8–27)
Bilirubin Total: 0.3 mg/dL (ref 0.0–1.2)
CO2: 23 mmol/L (ref 20–29)
Calcium: 9.3 mg/dL (ref 8.7–10.3)
Chloride: 100 mmol/L (ref 96–106)
Creatinine, Ser: 0.69 mg/dL (ref 0.57–1.00)
Globulin, Total: 2.7 g/dL (ref 1.5–4.5)
Glucose: 109 mg/dL — ABNORMAL HIGH (ref 70–99)
Potassium: 5.2 mmol/L (ref 3.5–5.2)
Sodium: 137 mmol/L (ref 134–144)
Total Protein: 7.2 g/dL (ref 6.0–8.5)
eGFR: 97 mL/min/{1.73_m2} (ref >59)

## 2023-06-30 LAB — CBC WITH DIFFERENTIAL/PLATELET
Basophils Absolute: 0.1 10*3/uL (ref 0.0–0.2)
Basos: 1 %
EOS (ABSOLUTE): 0.2 10*3/uL (ref 0.0–0.4)
Eos: 3 %
Hematocrit: 44.1 % (ref 34.0–46.6)
Hemoglobin: 14.5 g/dL (ref 11.1–15.9)
Immature Grans (Abs): 0 10*3/uL (ref 0.0–0.1)
Immature Granulocytes: 0 %
Lymphocytes Absolute: 2.1 10*3/uL (ref 0.7–3.1)
Lymphs: 39 %
MCH: 29.5 pg (ref 26.6–33.0)
MCHC: 32.9 g/dL (ref 31.5–35.7)
MCV: 90 fL (ref 79–97)
Monocytes Absolute: 0.4 10*3/uL (ref 0.1–0.9)
Monocytes: 8 %
Neutrophils Absolute: 2.6 10*3/uL (ref 1.4–7.0)
Neutrophils: 49 %
Platelets: 292 10*3/uL (ref 150–450)
RBC: 4.92 x10E6/uL (ref 3.77–5.28)
RDW: 13.5 % (ref 11.7–15.4)
WBC: 5.5 10*3/uL (ref 3.4–10.8)

## 2023-06-30 LAB — LIPID PANEL
Chol/HDL Ratio: 3.3 {ratio} (ref 0.0–4.4)
Cholesterol, Total: 225 mg/dL — ABNORMAL HIGH (ref 100–199)
HDL: 69 mg/dL (ref >39)
LDL Chol Calc (NIH): 134 mg/dL — ABNORMAL HIGH (ref 0–99)
Triglycerides: 123 mg/dL (ref 0–149)
VLDL Cholesterol Cal: 22 mg/dL (ref 5–40)

## 2023-06-30 LAB — HEMOGLOBIN A1C
Est. average glucose Bld gHb Est-mCnc: 137 mg/dL
Hgb A1c MFr Bld: 6.4 % — ABNORMAL HIGH (ref 4.8–5.6)

## 2023-06-30 NOTE — Assessment & Plan Note (Signed)
The current medical regimen is effective;  continue present plan and medications.

## 2023-06-30 NOTE — Assessment & Plan Note (Signed)
Well-controlled.  Continue Adderall 20 mg twice daily.

## 2023-06-30 NOTE — Assessment & Plan Note (Signed)
Well controlled.  No changes to medicines. Zetia 10 mg daily. Continue to work on eating a healthy diet and exercise.  Labs drawn today.

## 2023-06-30 NOTE — Assessment & Plan Note (Signed)
Well controlled.  No changes to medicines. Benicar 5 mg daily.  Continue to work on eating a healthy diet and exercise.  Labs drawn today.

## 2023-06-30 NOTE — Assessment & Plan Note (Signed)
Continue healthy diet and exercise.

## 2023-08-10 ENCOUNTER — Telehealth: Payer: Self-pay | Admitting: Family Medicine

## 2023-08-10 ENCOUNTER — Other Ambulatory Visit: Payer: Self-pay

## 2023-08-10 DIAGNOSIS — F900 Attention-deficit hyperactivity disorder, predominantly inattentive type: Secondary | ICD-10-CM

## 2023-08-10 DIAGNOSIS — I10 Essential (primary) hypertension: Secondary | ICD-10-CM

## 2023-08-10 NOTE — Telephone Encounter (Signed)
Refill request was sent to PCP.

## 2023-08-10 NOTE — Telephone Encounter (Signed)
 Last Fill: 06/29/23  Last OV: 06/29/23 Next OV: 10/05/23  Routing to provider for review/authorization.

## 2023-08-10 NOTE — Telephone Encounter (Unsigned)
 Copied from CRM [id number]. Topic: Clinical - Medication Refill >> Aug 10, 2023  4:50 PM Runell Gess P wrote: Most Recent Primary Care Visit:  Provider: [facility]  Department[facility]  Visit Type: OFFICE VISIT  Date: 06/29/2023  Medication: Adderall 20 mg  Has the patient contacted their pharmacy? No (Agent: If no, request that the patient contact the pharmacy for the refill. If patient does not wish to contact the pharmacy document the reason why and proceed with request.) (Agent: If yes, when and what did the pharmacy advise?)  Is this the correct pharmacy for this prescription? Yes If no, delete pharmacy and type the correct one.  This is the patient's preferred pharmacy:  [facility] [address] [place] - [address] [place] [address] Phone: [phone] Fax: [phone]  [facility] - [place], [place] - [address] [place] [address] Phone: [phone] Fax: [phone]   Has the prescription been filled recently? Yes  Is the patient out of the medication? Yes  Has the patient been seen for an appointment in the last year OR does the patient have an upcoming appointment? No  Can we respond through MyChart? Yes  Agent: Please be advised that Rx refills may take up to 3 business days. We ask that you follow-up with your pharmacy.

## 2023-08-13 MED ORDER — AMPHETAMINE-DEXTROAMPHETAMINE 20 MG PO TABS: 20.0000 mg | ORAL_TABLET | Freq: Two times a day (BID) | ORAL | 0 refills | Status: DC

## 2023-08-20 ENCOUNTER — Other Ambulatory Visit: Payer: Self-pay

## 2023-08-20 DIAGNOSIS — F900 Attention-deficit hyperactivity disorder, predominantly inattentive type: Secondary | ICD-10-CM

## 2023-08-20 MED ORDER — AMPHETAMINE-DEXTROAMPHETAMINE 20 MG PO TABS
20.0000 mg | ORAL_TABLET | Freq: Two times a day (BID) | ORAL | 0 refills | Status: DC
Start: 2023-08-20 — End: 2023-10-31

## 2023-08-20 NOTE — Telephone Encounter (Signed)
 Put the refill request. Can you please send it to [facility].  Copied from CRM [id number]. Topic: Clinical - Prescription Issue >> Aug 16, 2023  5:43 PM Martha Clan wrote: Reason for CRM: amphetamine-dextroamphetamine (ADDERALL) 20 MG tablet [[id number]] Pharmacy will not have the medication in stock until March 22nd.  Patient was wondering if she [facility] [address] [facility].

## 2023-08-21 ENCOUNTER — Other Ambulatory Visit: Payer: Self-pay | Admitting: Family Medicine

## 2023-09-22 ENCOUNTER — Other Ambulatory Visit: Payer: Self-pay | Admitting: Family Medicine

## 2023-09-23 ENCOUNTER — Other Ambulatory Visit: Payer: Self-pay | Admitting: Family Medicine

## 2023-09-25 ENCOUNTER — Telehealth: Payer: Self-pay

## 2023-09-25 NOTE — Telephone Encounter (Signed)
 I left a message on the number(s) listed in the patients chart requesting the patient to call back regarding the upcomming appointment for 10/05/2023. The provider is out of the office that day. The appointment has been canceled. Waiting for the patient to return the call.  I asked for the patient to ask to speak with Germain Kohler at the office. This appointment can been rescheduled to May 8 in the afternoon and have the patient come a few days prior for blood work.   NOTE: If the patient does not call back within a week to reschedule this appointment, the front staff will mail the patient a letter requesting to call the office back.

## 2023-09-28 NOTE — Telephone Encounter (Signed)
 I left a message for the patient to call the office to get the appointment rescheduled. I have mailed the patient a letter about this appointment.

## 2023-10-05 ENCOUNTER — Ambulatory Visit: Payer: 59 | Admitting: Family Medicine

## 2023-10-25 ENCOUNTER — Other Ambulatory Visit: Payer: Self-pay

## 2023-10-30 ENCOUNTER — Other Ambulatory Visit: Payer: Self-pay | Admitting: Family Medicine

## 2023-10-30 DIAGNOSIS — F900 Attention-deficit hyperactivity disorder, predominantly inattentive type: Secondary | ICD-10-CM

## 2023-10-30 NOTE — Telephone Encounter (Signed)
 Copied from CRM [id number]. Topic: Clinical - Medication Refill >> Oct 30, 2023  3:16 PM Alpha Arts wrote: Medication: amphetamine -dextroamphetamine  (ADDERALL) 20 MG tablet   Has the patient contacted their pharmacy? Yes (Agent: If no, request that the patient contact the pharmacy for the refill. If patient does not wish to contact the pharmacy document the reason why and proceed with request.) (Agent: If yes, when and what did the pharmacy advise?) Call Dr.  This is the patient's preferred pharmacy:  [facility] [address], [place] - [address] [place] [address] Phone: [phone] Fax: [phone]  Is this the correct pharmacy for this prescription? Yes If no, delete pharmacy and type the correct one.   Has the prescription been filled recently? Yes  Is the patient out of the medication? Yes  Has the patient been seen for an appointment in the last year OR does the patient have an upcoming appointment? Yes  Can we respond through MyChart? Yes  Agent: Please be advised that Rx refills may take up to 3 business days. We ask that you follow-up with your pharmacy.

## 2023-11-01 MED ORDER — AMPHETAMINE-DEXTROAMPHETAMINE 20 MG PO TABS: 20.0000 mg | ORAL_TABLET | Freq: Two times a day (BID) | ORAL | 0 refills | Status: DC

## 2023-11-01 NOTE — Telephone Encounter (Signed)
 Patient called to check on status of rx request. Advised pt rx still pending and awaiting provider's response. Advised pt of turnaround time however pt states she is going to be out today & requesting this be sent in.

## 2023-11-12 ENCOUNTER — Other Ambulatory Visit: Payer: Self-pay | Admitting: Family Medicine

## 2023-11-13 ENCOUNTER — Other Ambulatory Visit: Payer: Self-pay

## 2023-11-13 DIAGNOSIS — I10 Essential (primary) hypertension: Secondary | ICD-10-CM

## 2023-11-13 NOTE — Progress Notes (Signed)
 Subjective:  Patient ID: Brittany Walton, female    DOB: 01/22/1961  Age: 63 y.o. MRN: [id number]  Chief Complaint  Patient presents with   Medical Management of Chronic Issues    HPI: Prediabetes:  Healthy diet and exercise. Last A1c 6.4   Hyperlipidemia: Current medications: Zetia  10 mg daily.   Hypertension: Current medications: Benicar  5 mg daily.    GERD: Omeprazole  40 mg twice daily, but usually only has to take it once daily.   ADHD: Adderall 20 mg twice daily. Helps her focus.   ASTHMA: continue trelegy and singulair . Has albuterol .    Depression: on sertraline  50 mg daily.   Patient is concerned that she can feel her bladder in her vaginal area. She would like a referral to gynecologist. Not leaking.   Muscle cramps - see ros. Drinking lots of water.   [employer]avia: still doing. Lost weight, but went back up.      11/14/2023    9:16 AM 06/29/2023   10:40 AM 03/15/2023   10:28 AM 11/28/2022   10:21 AM 05/19/2022   10:38 AM  Depression screen PHQ 2/9  Decreased Interest 0 0 0 0 0  Down, Depressed, Hopeless 0 0 0 0 0  PHQ - 2 Score 0 0 0 0 0  Altered sleeping 0  0 0 0  Tired, decreased energy 0  0 1 0  Change in appetite 0  0 0 0  Feeling bad or failure about yourself  0  0 0 0  Trouble concentrating 0  0 0 0  Moving slowly or fidgety/restless 0  0 0 0  Suicidal thoughts 0  0 0 0  PHQ-9 Score 0  0 1 0  Difficult doing work/chores Not difficult at all  Not difficult at all Not difficult at all Not difficult at all        11/14/2023    9:14 AM  Fall Risk   Falls in the past year? 0  Number falls in past yr: 0  Injury with Fall? 0  Risk for fall due to : No Fall Risks  Follow up Falls evaluation completed    Patient Care Team: Mercy Stall, MD as PCP - General (Family Medicine)   Review of Systems  Constitutional:  Negative for chills, fatigue and fever.  HENT:  Negative for congestion, ear pain and sore throat.   Respiratory:  Negative for cough and  shortness of breath.   Cardiovascular:  Negative for chest pain and palpitations.  Gastrointestinal:  Negative for abdominal pain, constipation, diarrhea, nausea and vomiting.  Endocrine: Negative for polydipsia, polyphagia and polyuria.  Genitourinary:  Negative for difficulty urinating and dysuria.  Musculoskeletal:  Positive for myalgias (in BL legs. pt has started taking electrolyte drinks. taking magnesium. started 2-3 months ago.). Negative for arthralgias and back pain.  Skin:  Negative for rash.  Neurological:  Negative for headaches.  Psychiatric/Behavioral:  Negative for dysphoric mood. The patient is not nervous/anxious.     Current Outpatient Medications on File Prior to Visit  Medication Sig Dispense Refill   albuterol  (PROVENTIL ) (2.5 MG/3ML) 0.083% nebulizer solution Take 3 mLs (2.5 mg total) by nebulization every 6 (six) hours as needed for wheezing or shortness of breath. 75 mL 3   albuterol  (VENTOLIN  HFA) 108 (90 Base) MCG/ACT inhaler Inhale 1-2 puffs into the lungs every 6 (six) hours as needed for wheezing or shortness of breath. 1 each 6   amphetamine -dextroamphetamine  (ADDERALL) 20 MG tablet Take 1 tablet (20  mg total) by mouth 2 (two) times daily. 60 tablet 0   ezetimibe  (ZETIA ) 10 MG tablet TAKE 1 TABLET BY MOUTH EVERY DAY 90 tablet 1   fluticasone  (FLONASE ) 50 MCG/ACT nasal spray Place 2 sprays into both nostrils daily. 16 g 6   Fluticasone -Umeclidin-Vilant (TRELEGY ELLIPTA ) 100-62.5-25 MCG/ACT AEPB Inhale 1 puff by mouth once daily, 180 each 1   ibuprofen  (ADVIL ) 800 MG tablet TAKE 1 TABLET BY MOUTH EVERY 8 HOURS AS NEEDED 90 tablet 1   montelukast  (SINGULAIR ) 10 MG tablet TAKE 1 TABLET BY MOUTH EVERYDAY AT BEDTIME 90 tablet 3   olmesartan  (BENICAR ) 5 MG tablet TAKE 1 TABLET (5 MG TOTAL) BY MOUTH DAILY. 90 tablet 0   omeprazole  (PRILOSEC) 40 MG capsule TAKE 1 CAPSULE BY MOUTH TWICE A DAY 180 capsule 3   sertraline  (ZOLOFT ) 50 MG tablet TAKE 1 TABLET BY MOUTH EVERY DAY  90 tablet 1   No current facility-administered medications on file prior to visit.   Past Medical History:  Diagnosis Date   ADHD    Asthma    Biliary dyskinesia    Depression    GERD (gastroesophageal reflux disease)    Hyperlipidemia    Hypertension    Impaired fasting glucose    Lobar pneumonia (HCC) 09/23/2022   Obesity    Past Surgical History:  Procedure Laterality Date   ABDOMINAL HYSTERECTOMY  2006   patrial for uterine prolapse   HERNIA REPAIR  2009   umbilical, bilateral inguinal    Family History  Problem Relation Age of Onset   Heart attack Mother    Dementia Mother    Breast cancer Neg Hx    Social History   Socioeconomic History   Marital status: Married    Spouse name: Not on file   Number of children: 2   Years of education: Not on file   Highest education level: Bachelor's degree (e.g., BA, AB, BS)  Occupational History   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 She typically consumes wine.   Drug use: Never   Sexual activity: Yes    Partners: Male    Birth control/protection: None  Other Topics Concern   Not on file  Social History Narrative   Not on file   Social Drivers of Health   Financial Resource Strain: Low Risk  (11/13/2023)   Overall Financial Resource Strain (CARDIA)    Difficulty of Paying Living Expenses: Not very hard  Food Insecurity: No Food Insecurity (11/13/2023)   Hunger Vital Sign    Worried About Running Out of Food in the Last Year: Never true    Ran Out of Food in the Last Year: Never true  Transportation Needs: No Transportation Needs (11/13/2023)   PRAPARE - Administrator, Civil Service (Medical): No    Lack of Transportation (Non-Medical): No  Physical Activity: Insufficiently Active (11/13/2023)    Exercise Vital Sign    Days of Exercise per Week: 3 days    Minutes of Exercise per Session: 30 min  Stress: No Stress Concern Present (11/13/2023)   [employer] of Occupational Health - Occupational Stress Questionnaire    Feeling of Stress : Not at all  Social Connections: Unknown (11/13/2023)   Social Connection and Isolation Panel  Frequency of Communication with Friends and Family: Patient declined    Frequency of Social Gatherings with Friends and Family: Patient declined    Attends Religious Services: Patient declined    Database administrator or Organizations: Patient declined    Attends Engineer, structural: 1 to 4 times per year    Marital Status: Married    Objective:  BP 120/60   Pulse 75   Temp (!) 97.3 F (36.3 C)   Resp 16   Ht 5' 7 (1.702 m)   Wt 173 lb (78.5 kg)   SpO2 97%   BMI 27.10 kg/m      11/14/2023    9:03 AM 06/29/2023   10:34 AM 06/13/2023    1:25 PM  BP/Weight  Systolic BP 120 136 102  Diastolic BP 60 82 70  Wt. (Lbs) 173 171 171.4  BMI 27.1 kg/m2 26.78 kg/m2 26.85 kg/m2    Physical Exam Vitals reviewed.  Constitutional:      Appearance: Normal appearance.  Neck:     Vascular: No carotid bruit.   Cardiovascular:     Rate and Rhythm: Normal rate and regular rhythm.     Heart sounds: Normal heart sounds.  Pulmonary:     Effort: Pulmonary effort is normal. No respiratory distress.     Breath sounds: Normal breath sounds.  Abdominal:     General: Abdomen is flat. Bowel sounds are normal.     Palpations: Abdomen is soft.     Tenderness: There is no abdominal tenderness.   Neurological:     Mental Status: She is alert and oriented to person, place, and time.   Psychiatric:        Mood and Affect: Mood normal.        Behavior: Behavior normal.     Diabetic Foot Exam - Simple   No data filed      Lab Results  Component Value Date   WBC 5.5 11/14/2023   HGB 14.3 11/14/2023   HCT 42.8 11/14/2023   PLT 339  11/14/2023   GLUCOSE 113 (H) 11/14/2023   CHOL 202 (H) 11/14/2023   TRIG 110 11/14/2023   HDL 66 11/14/2023   LDLCALC 117 (H) 11/14/2023   ALT 14 11/14/2023   AST 15 11/14/2023   NA 139 11/14/2023   K 4.5 11/14/2023   CL 102 11/14/2023   CREATININE 0.61 11/14/2023   BUN 16 11/14/2023   CO2 19 (L) 11/14/2023   TSH 2.270 11/14/2023   HGBA1C 6.0 (H) 11/14/2023      Assessment & Plan:  Mixed hyperlipidemia Assessment & Plan: Well controlled.  No changes to medicines. Zetia  10 mg daily Continue to work on eating a healthy diet and exercise.  Labs drawn today.    Orders: -     Lipid panel  Primary hypertension Assessment & Plan: The current medical regimen is effective;  continue present plan and medications. Continue benicar  5 mg daily.  Continue to work on eating a healthy diet and exercise.  Labs drawn today.     Orders: -     CBC with Differential/Platelet -     Comprehensive metabolic panel with GFR  Attention deficit hyperactivity disorder (ADHD), predominantly inattentive type Assessment & Plan: Well-controlled.  Continue Adderall 20 mg twice daily.   Impaired fasting glucose Assessment & Plan: Recommend continue to work on eating healthy diet and exercise.   Orders: -     Hemoglobin A1c  Moderate persistent asthma without complication Assessment &  Plan: Continue Trelegy 1 inhalation daily.  Continue Singulair  10 mg once daily.  Keep albuterol  HFA with her.   Myalgia Assessment & Plan: Check labs.  Recommend tonic water or Hyland's drops.   Orders: -     Magnesium -     Phosphorus -     CK -     TSH  Cystocele with prolapse Assessment & Plan: Referring to urogynecology.   Orders: -     Ambulatory referral to Urogynecology  BMI 27.0-27.9,adult Assessment & Plan: Recommend continue to work on eating healthy diet and exercise.  Call your insurance to ask what weight loss medications are covered.      Mild recurrent major depression  (HCC) Assessment & Plan: The current medical regimen is effective;  continue present plan and medications. Continue zoloft  50 mg daily.    Gastroesophageal reflux disease without esophagitis Assessment & Plan: The current medical regimen is effective;  continue present plan and medications. Continue omeprazole  40 mg daily.        No orders of the defined types were placed in this encounter.   Orders Placed This Encounter  Procedures   CBC with Differential/Platelet   Comprehensive metabolic panel with GFR   Lipid panel   Hemoglobin A1c   Magnesium   Phosphorus   CK   TSH   Ambulatory referral to Urogynecology     Follow-up: Return in about 3 months (around 02/14/2024) for chronic follow up.   I,Marla I Leal-Borjas,acting as a scribe for Mercy Stall, MD.,have documented all relevant documentation on the behalf of Mercy Stall, MD,as directed by  Mercy Stall, MD while in the presence of Mercy Stall, MD.   An After Visit Summary was printed and given to the patient.  I attest that I have reviewed this visit and agree with the plan scribed by my staff.   Mercy Stall, MD [facility] [phone]

## 2023-11-14 ENCOUNTER — Ambulatory Visit: Admitting: Family Medicine

## 2023-11-14 ENCOUNTER — Encounter: Payer: Self-pay | Admitting: Family Medicine

## 2023-11-14 VITALS — BP 120/60 | HR 75 | Temp 97.3°F | Resp 16 | Ht 67.0 in | Wt 173.0 lb

## 2023-11-14 DIAGNOSIS — R7301 Impaired fasting glucose: Secondary | ICD-10-CM | POA: Diagnosis not present

## 2023-11-14 DIAGNOSIS — Z6827 Body mass index (BMI) 27.0-27.9, adult: Secondary | ICD-10-CM | POA: Insufficient documentation

## 2023-11-14 DIAGNOSIS — F330 Major depressive disorder, recurrent, mild: Secondary | ICD-10-CM

## 2023-11-14 DIAGNOSIS — E782 Mixed hyperlipidemia: Secondary | ICD-10-CM

## 2023-11-14 DIAGNOSIS — F900 Attention-deficit hyperactivity disorder, predominantly inattentive type: Secondary | ICD-10-CM

## 2023-11-14 DIAGNOSIS — M791 Myalgia, unspecified site: Secondary | ICD-10-CM

## 2023-11-14 DIAGNOSIS — N814 Uterovaginal prolapse, unspecified: Secondary | ICD-10-CM

## 2023-11-14 DIAGNOSIS — J4540 Moderate persistent asthma, uncomplicated: Secondary | ICD-10-CM

## 2023-11-14 DIAGNOSIS — K219 Gastro-esophageal reflux disease without esophagitis: Secondary | ICD-10-CM

## 2023-11-14 DIAGNOSIS — I10 Essential (primary) hypertension: Secondary | ICD-10-CM | POA: Diagnosis not present

## 2023-11-14 NOTE — Patient Instructions (Addendum)
 For muscle cramps: recommend tonic water or Hyland's drops.   Call your insurance to ask what weight loss medications are covered.   I am going to refer you to a urogynecologist for the dropped bladder.

## 2023-11-15 ENCOUNTER — Ambulatory Visit: Payer: Self-pay | Admitting: Family Medicine

## 2023-11-15 LAB — CBC WITH DIFFERENTIAL/PLATELET
Basophils Absolute: 0 10*3/uL (ref 0.0–0.2)
Basos: 1 %
EOS (ABSOLUTE): 0.2 10*3/uL (ref 0.0–0.4)
Eos: 4 %
Hematocrit: 42.8 % (ref 34.0–46.6)
Hemoglobin: 14.3 g/dL (ref 11.1–15.9)
Immature Grans (Abs): 0 10*3/uL (ref 0.0–0.1)
Immature Granulocytes: 0 %
Lymphocytes Absolute: 1.7 10*3/uL (ref 0.7–3.1)
Lymphs: 30 %
MCH: 30.8 pg (ref 26.6–33.0)
MCHC: 33.4 g/dL (ref 31.5–35.7)
MCV: 92 fL (ref 79–97)
Monocytes Absolute: 0.5 10*3/uL (ref 0.1–0.9)
Monocytes: 9 %
Neutrophils Absolute: 3.1 10*3/uL (ref 1.4–7.0)
Neutrophils: 56 %
Platelets: 339 10*3/uL (ref 150–450)
RBC: 4.64 x10E6/uL (ref 3.77–5.28)
RDW: 13.7 % (ref 11.7–15.4)
WBC: 5.5 10*3/uL (ref 3.4–10.8)

## 2023-11-15 LAB — COMPREHENSIVE METABOLIC PANEL WITH GFR
ALT: 14 IU/L (ref 0–32)
AST: 15 IU/L (ref 0–40)
Albumin: 4.6 g/dL (ref 3.9–4.9)
Alkaline Phosphatase: 85 IU/L (ref 44–121)
BUN/Creatinine Ratio: 26 (ref 12–28)
BUN: 16 mg/dL (ref 8–27)
Bilirubin Total: <0.2 mg/dL (ref 0.0–1.2)
CO2: 19 mmol/L — ABNORMAL LOW (ref 20–29)
Calcium: 9.4 mg/dL (ref 8.7–10.3)
Chloride: 102 mmol/L (ref 96–106)
Creatinine, Ser: 0.61 mg/dL (ref 0.57–1.00)
Globulin, Total: 2.3 g/dL (ref 1.5–4.5)
Glucose: 113 mg/dL — ABNORMAL HIGH (ref 70–99)
Potassium: 4.5 mmol/L (ref 3.5–5.2)
Sodium: 139 mmol/L (ref 134–144)
Total Protein: 6.9 g/dL (ref 6.0–8.5)
eGFR: 100 mL/min/{1.73_m2} (ref >59)

## 2023-11-15 LAB — TSH: TSH: 2.27 u[IU]/mL (ref 0.450–4.500)

## 2023-11-15 LAB — LIPID PANEL
Chol/HDL Ratio: 3.1 ratio (ref 0.0–4.4)
Cholesterol, Total: 202 mg/dL — ABNORMAL HIGH (ref 100–199)
HDL: 66 mg/dL (ref >39)
LDL Chol Calc (NIH): 117 mg/dL — ABNORMAL HIGH (ref 0–99)
Triglycerides: 110 mg/dL (ref 0–149)
VLDL Cholesterol Cal: 19 mg/dL (ref 5–40)

## 2023-11-15 LAB — HEMOGLOBIN A1C
Est. average glucose Bld gHb Est-mCnc: 126 mg/dL
Hgb A1c MFr Bld: 6 % — ABNORMAL HIGH (ref 4.8–5.6)

## 2023-11-15 LAB — PHOSPHORUS: Phosphorus: 3.4 mg/dL (ref 3.0–4.3)

## 2023-11-15 LAB — CK: Total CK: 97 U/L (ref 32–182)

## 2023-11-15 LAB — MAGNESIUM: Magnesium: 2.3 mg/dL (ref 1.6–2.3)

## 2023-11-17 NOTE — Assessment & Plan Note (Signed)
 Well-controlled.  Continue Adderall 20 mg twice daily.

## 2023-11-17 NOTE — Assessment & Plan Note (Addendum)
 Recommend continue to work on eating healthy diet and exercise.  Call your insurance to ask what weight loss medications are covered.

## 2023-11-17 NOTE — Assessment & Plan Note (Signed)
 The current medical regimen is effective;  continue present plan and medications. Continue omeprazole 40 mg daily.

## 2023-11-17 NOTE — Assessment & Plan Note (Signed)
 Referring to urogynecology.

## 2023-11-17 NOTE — Assessment & Plan Note (Signed)
 Recommend continue to work on eating healthy diet and exercise.

## 2023-11-17 NOTE — Assessment & Plan Note (Signed)
 The current medical regimen is effective;  continue present plan and medications. Continue zoloft 50 mg daily

## 2023-11-17 NOTE — Assessment & Plan Note (Signed)
 The current medical regimen is effective;  continue present plan and medications. Continue benicar  5 mg daily.  Continue to work on eating a healthy diet and exercise.  Labs drawn today.

## 2023-11-17 NOTE — Assessment & Plan Note (Signed)
Continue Trelegy 1 inhalation daily.  Continue Singulair 10 mg once daily.  Keep albuterol HFA with her.

## 2023-11-17 NOTE — Assessment & Plan Note (Signed)
 Well controlled.  No changes to medicines. Zetia 10 mg daily. Continue to work on eating a healthy diet and exercise.  Labs drawn today.

## 2023-11-17 NOTE — Assessment & Plan Note (Addendum)
 Check labs.  Recommend tonic water or Hyland's drops.

## 2023-12-24 ENCOUNTER — Other Ambulatory Visit: Payer: Self-pay | Admitting: Family Medicine

## 2023-12-24 DIAGNOSIS — F900 Attention-deficit hyperactivity disorder, predominantly inattentive type: Secondary | ICD-10-CM

## 2023-12-24 MED ORDER — AMPHETAMINE-DEXTROAMPHETAMINE 20 MG PO TABS: 20.0000 mg | ORAL_TABLET | Freq: Two times a day (BID) | ORAL | 0 refills | Status: DC

## 2023-12-24 NOTE — Telephone Encounter (Signed)
 Copied from CRM [id number]. Topic: Clinical - Medication Refill >> Dec 24, 2023  4:36 PM Everette C wrote: Medication: amphetamine -dextroamphetamine  (ADDERALL) 20 MG tablet  Has the patient contacted their pharmacy? Yes (Agent: If no, request that the patient contact the pharmacy for the refill. If patient does not wish to contact the pharmacy document the reason why and proceed with request.) (Agent: If yes, when and what did the pharmacy advise?)  This is the patient's preferred pharmacy:  [facility] [address], [place] - [address] [place] [address] Phone: [phone] Fax: [phone]  Is this the correct pharmacy for this prescription? Yes If no, delete pharmacy and type the correct one.   Has the prescription been filled recently? Yes  Is the patient out of the medication? Yes  Has the patient been seen for an appointment in the last year OR does the patient have an upcoming appointment? Yes  Can we respond through MyChart? No  Agent: Please be advised that Rx refills may take up to 3 business days. We ask that you follow-up with your pharmacy.

## 2024-01-11 ENCOUNTER — Other Ambulatory Visit: Payer: Self-pay | Admitting: Family Medicine

## 2024-01-11 DIAGNOSIS — J4540 Moderate persistent asthma, uncomplicated: Secondary | ICD-10-CM

## 2024-01-11 NOTE — Telephone Encounter (Signed)
 Copied from CRM [id number]. Topic: Clinical - Medication Refill >> Jan 11, 2024  3:28 PM Winona R wrote: Pt would like to know if it can be expedite it  as she really needs it. I offered for her to speak with triage but she declined.   Medication: Fluticasone -Umeclidin-Vilant (TRELEGY ELLIPTA ) 100-62.5-25 MCG/ACT AEPB  Has the patient contacted their pharmacy? Yes, pharmacy also requested refill  (Agent: If no, request that the patient contact the pharmacy for the refill. If patient does not wish to contact the pharmacy document the reason why and proceed with request.) (Agent: If yes, when and what did the pharmacy advise?)  This is the patient's preferred pharmacy:  [facility] [address] [place] - [address] [place] [address] Phone: [phone] Fax: [phone]   Is this the correct pharmacy for this prescription? Yes If no, delete pharmacy and type the correct one.   Has the prescription been filled recently? Yes  Is the patient out of the medication? Yes  Has the patient been seen for an appointment in the last year OR does the patient have an upcoming appointment? Yes  Can we respond through MyChart? Yes  Agent: Please be advised that Rx refills may take up to 3 business days. We ask that you follow-up with your pharmacy.

## 2024-01-12 MED ORDER — TRELEGY ELLIPTA 100-62.5-25 MCG/ACT IN AEPB: INHALATION_SPRAY | RESPIRATORY_TRACT | 1 refills | Status: DC

## 2024-01-14 ENCOUNTER — Other Ambulatory Visit: Payer: Self-pay | Admitting: Family Medicine

## 2024-01-14 ENCOUNTER — Other Ambulatory Visit: Payer: Self-pay

## 2024-01-14 ENCOUNTER — Ambulatory Visit: Payer: Self-pay

## 2024-01-14 DIAGNOSIS — J4540 Moderate persistent asthma, uncomplicated: Secondary | ICD-10-CM

## 2024-01-14 MED ORDER — TRELEGY ELLIPTA 100-62.5-25 MCG/ACT IN AEPB: INHALATION_SPRAY | RESPIRATORY_TRACT | 1 refills | Status: DC

## 2024-01-14 NOTE — Telephone Encounter (Addendum)
 FYI Only or Action Required?: FYI only for provider. Pt would like a sooner appt than weds. Pt is out of her Trelegy medication.  Patient was last seen in primary care on 11/14/2023 by Sherre Clapper, MD.  Called Nurse Triage reporting Cough.  Symptoms began several weeks ago.  Interventions attempted: Other: Albuterol  inhaler - cough medicine.  Symptoms are: gradually worsening.  Triage Disposition: See PCP When Office is Open (Within 3 Days)  Patient/caregiver understands and will follow disposition?: Yes                  Copied from CRM [id number]. Topic: Clinical - Red Word Triage >> Jan 14, 2024  3:58 PM Delon HERO wrote: Red Word that prompted transfer to Nurse Triage: Patient is calling to report that she is coughing and wheezing - with a dry cough. Shallow breathes at night. Please advise Reason for Disposition  Cough has been present for > 3 weeks  Answer Assessment - Initial Assessment Questions 1. ONSET: When did the cough begin?      1-2 weeks 2. SEVERITY: How bad is the cough today?      Moderate 3. SPUTUM: Describe the color of your sputum (e.g., none, dry cough; clear, white, yellow, green)     Green & brown 4. HEMOPTYSIS: Are you coughing up any blood? If Yes, ask: How much? (e.g., flecks, streaks, tablespoons, etc.)     no 5. DIFFICULTY BREATHING: Are you having difficulty breathing? If Yes, ask: How bad is it? (e.g., mild, moderate, severe)      moderate 6. FEVER: Do you have a fever? If Yes, ask: What is your temperature, how was it measured, and when did it start?     no 7. CARDIAC HISTORY: Do you have any history of heart disease? (e.g., heart attack, congestive heart failure)      no 8. LUNG HISTORY: Do you have any history of lung disease?  (e.g., pulmonary embolus, asthma, emphysema)     Asthma 10. OTHER SYMPTOMS: Do you have any other symptoms? (e.g., runny nose, wheezing, chest pain)       Wheezing, coughing chest  pain  Protocols used: Cough - Acute Non-Productive-A-AH

## 2024-01-14 NOTE — Telephone Encounter (Signed)
 Patient declined to r/s appointment to tomorrow with Harrie. Preferred to keep appointment with Dr. Sherre on Wednesday.

## 2024-01-15 MED ORDER — TRELEGY ELLIPTA 100-62.5-25 MCG/ACT IN AEPB: INHALATION_SPRAY | RESPIRATORY_TRACT | 1 refills | Status: DC

## 2024-01-16 ENCOUNTER — Ambulatory Visit: Admitting: Family Medicine

## 2024-01-16 VITALS — BP 136/74 | HR 76 | Temp 98.2°F | Ht 67.0 in | Wt 177.0 lb

## 2024-01-16 DIAGNOSIS — Z1231 Encounter for screening mammogram for malignant neoplasm of breast: Secondary | ICD-10-CM | POA: Diagnosis not present

## 2024-01-16 DIAGNOSIS — H1032 Unspecified acute conjunctivitis, left eye: Secondary | ICD-10-CM | POA: Diagnosis not present

## 2024-01-16 DIAGNOSIS — J4541 Moderate persistent asthma with (acute) exacerbation: Secondary | ICD-10-CM | POA: Diagnosis not present

## 2024-01-16 MED ORDER — AZITHROMYCIN 250 MG PO TABS: ORAL_TABLET | ORAL | 0 refills | Status: DC

## 2024-01-16 MED ORDER — AIRSUPRA 90-80 MCG/ACT IN AERO: 2.0000 | INHALATION_SPRAY | Freq: Four times a day (QID) | RESPIRATORY_TRACT | 2 refills | Status: DC | PRN

## 2024-01-16 MED ORDER — PREDNISONE 50 MG PO TABS: 50.0000 mg | ORAL_TABLET | Freq: Every day | ORAL | 0 refills | Status: DC

## 2024-01-16 MED ORDER — TRELEGY ELLIPTA 100-62.5-25 MCG/ACT IN AEPB
INHALATION_SPRAY | RESPIRATORY_TRACT | 3 refills | Status: DC
Start: 2024-01-16 — End: 2024-02-22

## 2024-01-16 MED ORDER — IPRATROPIUM-ALBUTEROL 0.5-2.5 (3) MG/3ML IN SOLN: 3.0000 mL | Freq: Once | RESPIRATORY_TRACT | Status: DC

## 2024-01-16 MED ORDER — TRIAMCINOLONE ACETONIDE 40 MG/ML IJ SUSP
80.0000 mg | Freq: Once | INTRAMUSCULAR | Status: AC
  Administered 2024-01-16 (×2): 80 mg via INTRAMUSCULAR

## 2024-01-16 MED ORDER — POLYMYXIN B-TRIMETHOPRIM 10000-0.1 UNIT/ML-% OP SOLN: 2.0000 [drp] | Freq: Four times a day (QID) | OPHTHALMIC | 0 refills | Status: AC

## 2024-01-16 NOTE — Progress Notes (Signed)
 Acute Office Visit  Subjective:    Patient ID: Brittany Walton, female    DOB: 1961-03-15, 63 y.o.   MRN: [id number]  Chief Complaint  Patient presents with   Cough   Discussed the use of AI scribe software for clinical note transcription with the patient, who gave verbal consent to proceed.  History of Present Illness   Brittany Walton is a 63 year old female with asthma who presents with respiratory symptoms after running out of her Trelegy inhaler.  Respiratory symptoms - Asthma with exacerbation after running out of Trelegy inhaler approximately 1.5 weeks ago - Unable to use Trelegy for one week while out of town; only a few puffs left upon return, then completely ran out - Persistent cough, wheezing, and shortness of breath - No fever, though temperature has not been checked recently - Wakes up at night feeling unable to breathe - Uses albuterol  inhaler a couple of times daily with minimal relief - Consumed two bottles of Robitussin for symptom management  Nocturnal dyspnea and back pain - Wakes up at night with sensation of being unable to breathe - Associated with significant back pain  Ocular symptoms - Left eye matted and itchy upon waking yesterday - Slight improvement in ocular symptoms today - Attended grandson's birthday party over the weekend, which may be relevant to eye symptoms      Past Medical History:  Diagnosis Date   ADHD    Asthma    Biliary dyskinesia    Depression    GERD (gastroesophageal reflux disease)    Hyperlipidemia    Hypertension    Impaired fasting glucose    Lobar pneumonia (HCC) 09/23/2022   Obesity     Past Surgical History:  Procedure Laterality Date   ABDOMINAL HYSTERECTOMY  2006   patrial for uterine prolapse   HERNIA REPAIR  2009   umbilical, bilateral inguinal    Family History  Problem Relation Age of Onset   Heart attack Mother    Dementia Mother    Breast cancer Neg Hx     Social History   Socioeconomic  History   Marital status: Married    Spouse name: Not on file   Number of children: 2   Years of education: Not on file   Highest education level: Bachelor's degree (e.g., BA, AB, BS)  Occupational History   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 She typically consumes wine.   Drug use: Never   Sexual activity: Yes    Partners: Male    Birth control/protection: None  Other Topics Concern   Not on file  Social History Narrative   Not on file   Social Drivers of Health   Financial Resource Strain: Low Risk  (01/16/2024)   Overall Financial Resource Strain (CARDIA)    Difficulty of Paying Living Expenses: Not very hard  Food Insecurity: No Food Insecurity (01/16/2024)   Hunger Vital Sign    Worried About Running Out of Food in the Last Year: Never true    Ran Out of Food in the Last Year: Never true  Transportation Needs: No Transportation Needs (01/16/2024)   PRAPARE - Administrator, Civil Service (Medical): No    Lack of Transportation (Non-Medical): No  Physical Activity:  Insufficiently Active (01/16/2024)   Exercise Vital Sign    Days of Exercise per Week: 2 days    Minutes of Exercise per Session: 10 min  Stress: No Stress Concern Present (01/16/2024)   [employer] of Occupational Health - Occupational Stress Questionnaire    Feeling of Stress: Only a little  Social Connections: Socially Integrated (01/16/2024)   Social Connection and Isolation Panel    Frequency of Communication with Friends and Family: More than three times a week    Frequency of Social Gatherings with Friends and Family: Once a week    Attends Religious Services: 1 to 4 times per year    Active Member of [employer] or Organizations: Yes    Attends Banker Meetings: Patient  declined    Marital Status: Married  Catering manager Violence: Not At Risk (03/15/2023)   Humiliation, Afraid, Rape, and Kick questionnaire    Fear of Current or Ex-Partner: No    Emotionally Abused: No    Physically Abused: No    Sexually Abused: No    Outpatient Medications Prior to Visit  Medication Sig Dispense Refill   albuterol  (PROVENTIL ) (2.5 MG/3ML) 0.083% nebulizer solution Take 3 mLs (2.5 mg total) by nebulization every 6 (six) hours as needed for wheezing or shortness of breath. 75 mL 3   amphetamine -dextroamphetamine  (ADDERALL) 20 MG tablet Take 1 tablet (20 mg total) by mouth 2 (two) times daily. 60 tablet 0   ezetimibe  (ZETIA ) 10 MG tablet TAKE 1 TABLET BY MOUTH EVERY DAY 90 tablet 1   fluticasone  (FLONASE ) 50 MCG/ACT nasal spray Place 2 sprays into both nostrils daily. 16 g 6   ibuprofen  (ADVIL ) 800 MG tablet TAKE 1 TABLET BY MOUTH EVERY 8 HOURS AS NEEDED 90 tablet 1   montelukast  (SINGULAIR ) 10 MG tablet TAKE 1 TABLET BY MOUTH EVERYDAY AT BEDTIME 90 tablet 3   olmesartan  (BENICAR ) 5 MG tablet TAKE 1 TABLET (5 MG TOTAL) BY MOUTH DAILY. 90 tablet 0   omeprazole  (PRILOSEC) 40 MG capsule TAKE 1 CAPSULE BY MOUTH TWICE A DAY 180 capsule 3   sertraline  (ZOLOFT ) 50 MG tablet TAKE 1 TABLET BY MOUTH EVERY DAY 90 tablet 1   albuterol  (VENTOLIN  HFA) 108 (90 Base) MCG/ACT inhaler Inhale 1-2 puffs into the lungs every 6 (six) hours as needed for wheezing or shortness of breath. 1 each 6   Fluticasone -Umeclidin-Vilant (TRELEGY ELLIPTA ) 100-62.5-25 MCG/ACT AEPB Inhale 1 puff by mouth once daily, 180 each 1   No facility-administered medications prior to visit.    Allergies  Allergen Reactions   Atorvastatin Other (See Comments)    Muscle cramps  Other Reaction(s): Other (See Comments)    Muscle cramps   Codeine    Crestor [Rosuvastatin] Other (See Comments)    Myalgias   Diphenhydramine Hcl    Elemental Sulfur    Sulfur     Review of Systems  Constitutional:  Negative  for chills, fatigue and fever.  HENT:  Negative for congestion, ear pain, postnasal drip, rhinorrhea, sinus pressure, sinus pain and sore throat.   Respiratory:  Positive for cough and wheezing. Negative for shortness of breath.   Cardiovascular:  Negative for chest pain.  Gastrointestinal:  Negative for diarrhea and nausea.  Musculoskeletal:  Positive for back pain (Upper. From coughing).  Neurological:  Negative for dizziness and headaches.       Objective:        01/16/2024   10:53 AM 11/14/2023    9:03 AM 06/29/2023   10:34  AM  Vitals with BMI  Height 5' 7 5' 7 5' 7  Weight 177 lbs 173 lbs 171 lbs  BMI 27.72 27.09 26.78  Systolic 136 120 863  Diastolic 74 60 82  Pulse 76 75 73    No data found.   Physical Exam Vitals reviewed.  Constitutional:      Appearance: Normal appearance.  HENT:     Right Ear: Tympanic membrane, ear canal and external ear normal.     Left Ear: Tympanic membrane, ear canal and external ear normal.     Nose: Nose normal.     Mouth/Throat:     Pharynx: Oropharynx is clear.  Eyes:     General:        Right eye: No discharge.        Left eye: Discharge present. Cardiovascular:     Rate and Rhythm: Normal rate and regular rhythm.     Heart sounds: Normal heart sounds. No murmur heard. Pulmonary:     Effort: No respiratory distress.     Comments: Poor air exchange. Duoneb treatment given. Improved air exchange. Some wheezing.  Lymphadenopathy:     Cervical: No cervical adenopathy.  Neurological:     Mental Status: She is alert and oriented to person, place, and time.  Psychiatric:        Mood and Affect: Mood normal.        Behavior: Behavior normal.     Health Maintenance Due  Topic Date Due   HIV Screening  Never done   Pneumococcal Vaccine: 50+ Years (1 of 2 - PCV) Never done   Zoster Vaccines- Shingrix (1 of 2) Never done   DTaP/Tdap/Td (2 - Td or Tdap) 02/06/2021   COVID-19 Vaccine (5 - 2024-25 season) 02/04/2023   MAMMOGRAM   09/01/2023    There are no preventive care reminders to display for this patient.   Lab Results  Component Value Date   TSH 2.270 11/14/2023   Lab Results  Component Value Date   WBC 5.5 11/14/2023   HGB 14.3 11/14/2023   HCT 42.8 11/14/2023   MCV 92 11/14/2023   PLT 339 11/14/2023   Lab Results  Component Value Date   NA 139 11/14/2023   K 4.5 11/14/2023   CO2 19 (L) 11/14/2023   GLUCOSE 113 (H) 11/14/2023   BUN 16 11/14/2023   CREATININE 0.61 11/14/2023   BILITOT <0.2 11/14/2023   ALKPHOS 85 11/14/2023   AST 15 11/14/2023   ALT 14 11/14/2023   PROT 6.9 11/14/2023   ALBUMIN 4.6 11/14/2023   CALCIUM 9.4 11/14/2023   EGFR 100 11/14/2023   Lab Results  Component Value Date   CHOL 202 (H) 11/14/2023   Lab Results  Component Value Date   HDL 66 11/14/2023   Lab Results  Component Value Date   LDLCALC 117 (H) 11/14/2023   Lab Results  Component Value Date   TRIG 110 11/14/2023   Lab Results  Component Value Date   CHOLHDL 3.1 11/14/2023   Lab Results  Component Value Date   HGBA1C 6.0 (H) 11/14/2023       Assessment & Plan:  Moderate persistent asthma with exacerbation Assessment & Plan: Albuterol  provides minimal relief. - Administer DuoNeb treatment. - Administer Kenalog  80 mg IM. - Prescribe prednisone  50 mg once daily for 5 days. - Change albuterol  to [employer], 2 puffs four times a day as needed. - Prescribe Z-Pak (azithromycin ).  Orders: -     Trelegy Ellipta ; Inhale  1 puff by mouth once daily,  Dispense: 180 each; Refill: 3 -     Airsupra ; Inhale 2 puffs into the lungs 4 (four) times daily as needed.  Dispense: 10.7 g; Refill: 2 -     Triamcinolone  Acetonide -     Ipratropium-Albuterol  -     predniSONE ; Take 1 tablet (50 mg total) by mouth daily with breakfast.  Dispense: 5 tablet; Refill: 0 -     Azithromycin ; 2 DAILY FOR FIRST DAY, THEN DECREASE TO ONE DAILY FOR 4 MORE DAYS.  Dispense: 6 tablet; Refill: 0 -     Polymyxin  B-Trimethoprim ; Place 2 drops into the left eye every 6 (six) hours for 7 days.  Dispense: 10 mL; Refill: 0 -     3D Screening Mammogram, Left and Right; Future  Encounter for screening mammogram for malignant neoplasm of breast Assessment & Plan: Ordered  Orders: -     3D Screening Mammogram, Left and Right; Future  Acute bacterial conjunctivitis of left eye Assessment & Plan: Polytrim  eye drops sent.   Orders: -     Polymyxin B -Trimethoprim ; Place 2 drops into the left eye every 6 (six) hours for 7 days.  Dispense: 10 mL; Refill: 0     Meds ordered this encounter  Medications   Fluticasone -Umeclidin-Vilant (TRELEGY ELLIPTA ) 100-62.5-25 MCG/ACT AEPB    Sig: Inhale 1 puff by mouth once daily,    Dispense:  180 each    Refill:  3   Albuterol -Budesonide (AIRSUPRA ) 90-80 MCG/ACT AERO    Sig: Inhale 2 puffs into the lungs 4 (four) times daily as needed.    Dispense:  10.7 g    Refill:  2   triamcinolone  acetonide (KENALOG -40) injection 80 mg   ipratropium-albuterol  (DUONEB) 0.5-2.5 (3) MG/3ML nebulizer solution 3 mL   predniSONE  (DELTASONE ) 50 MG tablet    Sig: Take 1 tablet (50 mg total) by mouth daily with breakfast.    Dispense:  5 tablet    Refill:  0   azithromycin  (ZITHROMAX ) 250 MG tablet    Sig: 2 DAILY FOR FIRST DAY, THEN DECREASE TO ONE DAILY FOR 4 MORE DAYS.    Dispense:  6 tablet    Refill:  0   trimethoprim -polymyxin b  (POLYTRIM ) ophthalmic solution    Sig: Place 2 drops into the left eye every 6 (six) hours for 7 days.    Dispense:  10 mL    Refill:  0    Orders Placed This Encounter  Procedures   MM 3D SCREENING MAMMOGRAM BILATERAL BREAST    Follow-up: Return if symptoms worsen or fail to improve.  An After Visit Summary was printed and given to the patient.  Abigail Free, MD [facility] [phone]

## 2024-01-19 ENCOUNTER — Encounter: Payer: Self-pay | Admitting: Family Medicine

## 2024-01-19 DIAGNOSIS — Z1231 Encounter for screening mammogram for malignant neoplasm of breast: Secondary | ICD-10-CM | POA: Insufficient documentation

## 2024-01-19 DIAGNOSIS — H1032 Unspecified acute conjunctivitis, left eye: Secondary | ICD-10-CM | POA: Insufficient documentation

## 2024-01-19 NOTE — Assessment & Plan Note (Signed)
 Polytrim  eye drops sent

## 2024-01-19 NOTE — Assessment & Plan Note (Signed)
 Albuterol  provides minimal relief. - Administer DuoNeb treatment. - Administer Kenalog  80 mg IM. - Prescribe prednisone  50 mg once daily for 5 days. - Change albuterol  to [employer], 2 puffs four times a day as needed. - Prescribe Z-Pak (azithromycin ).

## 2024-01-19 NOTE — Assessment & Plan Note (Signed)
 Ordered

## 2024-02-01 ENCOUNTER — Other Ambulatory Visit: Payer: Self-pay | Admitting: Family Medicine

## 2024-02-01 DIAGNOSIS — F900 Attention-deficit hyperactivity disorder, predominantly inattentive type: Secondary | ICD-10-CM

## 2024-02-01 NOTE — Telephone Encounter (Signed)
 Copied from CRM [id number]. Topic: Clinical - Medication Refill >> Feb 01, 2024  2:14 PM Lauren C wrote: Medication: amphetamine -dextroamphetamine  (ADDERALL) 20 MG tablet   Has the patient contacted their pharmacy? Yes (Agent: If no, request that the patient contact the pharmacy for the refill. If patient does not wish to contact the pharmacy document the reason why and proceed with request.) (Agent: If yes, when and what did the pharmacy advise?)  She needs new prescription, out of refills.  This is the patient's preferred pharmacy:  [facility] [address], [place] - [address] [place] [address] Phone: [phone] Fax: [phone]  Is this the correct pharmacy for this prescription? Yes If no, delete pharmacy and type the correct one.   Has the prescription been filled recently? Yes  Is the patient out of the medication? No, 1 left  Has the patient been seen for an appointment in the last year OR does the patient have an upcoming appointment? Yes  Can we respond through MyChart? No  Agent: Please be advised that Rx refills may take up to 3 business days. We ask that you follow-up with your pharmacy.

## 2024-02-05 MED ORDER — AMPHETAMINE-DEXTROAMPHETAMINE 20 MG PO TABS: 20.0000 mg | ORAL_TABLET | Freq: Two times a day (BID) | ORAL | 0 refills | Status: DC

## 2024-02-08 ENCOUNTER — Other Ambulatory Visit: Payer: Self-pay | Admitting: Family Medicine

## 2024-02-08 DIAGNOSIS — I10 Essential (primary) hypertension: Secondary | ICD-10-CM

## 2024-02-21 NOTE — Progress Notes (Unsigned)
 Subjective:  Patient ID: Brittany Walton, female    DOB: 1961-03-23  Age: 63 y.o. MRN: [id number]  Chief Complaint  Patient presents with   Medical Management of Chronic Issues    HPI: Discussed the use of AI scribe software for clinical note transcription with the patient, who gave verbal consent to proceed.  History of Present Illness Brittany Walton is a 63 year old female who presents for a routine follow-up visit.  Glycemic control - Hemoglobin A1c decreased from 6.0 to 5.8  Hyperlipidemia - Currently taking Zetia  10 mg once daily - Cholesterol levels stable - LDL improved from 117 to 114 - Previously trialed Crestor, not well tolerated - No prior use of Lipitor  Hypertension - Blood pressure 124/82 - Currently taking olmesartan  5 mg once daily, reduced from 20 mg  Attention deficit symptoms - Adderall 20 mg twice daily for attention - Symptoms well controlled  Mood, anxiety, and depression - Sertraline  50 mg once daily - Mood stable, feeling 'happy'  Gastrointestinal symptoms - Omeprazole  40 mg once daily for gastrointestinal issues  Allergic rhinitis - Singulair  for allergies - Flonase  used as needed  Asthma - Trelegy once daily for maintenance - Airsupra  hfa 2 puffs four times a day as needed.  - No significant shortness of breath during walking - No recent chest pain, breathing problems, or coughing  Musculoskeletal pain - Ibuprofen  used occasionally as needed  Physical activity and weight management - Engages in walking as primary exercise, typically to the end of her street and back, sometimes multiple times, totaling approximately half a mile - Perceives current activity level as insufficient - Previously trialed Contrave  for weight loss, not effective  Preventive health maintenance - Due for tetanus, pneumonia, and shingles vaccines - History of chickenpox, increasing risk for shingles - No recent mammogram due to scheduling  conflicts  Constitutional and upper respiratory symptoms - No recent fevers, chills, sweats, earaches, sore throat, or stuffy nose       02/22/2024   10:20 AM 11/14/2023    9:16 AM 06/29/2023   10:40 AM 03/15/2023   10:28 AM 11/28/2022   10:21 AM  Depression screen PHQ 2/9  Decreased Interest 0 0 0 0 0  Down, Depressed, Hopeless 0 0 0 0 0  PHQ - 2 Score 0 0 0 0 0  Altered sleeping 0 0  0 0  Tired, decreased energy 0 0  0 1  Change in appetite 0 0  0 0  Feeling bad or failure about yourself  0 0  0 0  Trouble concentrating 0 0  0 0  Moving slowly or fidgety/restless 0 0  0 0  Suicidal thoughts 0 0  0 0  PHQ-9 Score 0 0  0 1  Difficult doing work/chores Not difficult at all Not difficult at all  Not difficult at all Not difficult at all        02/22/2024   10:20 AM  Fall Risk   Falls in the past year? 0  Number falls in past yr: 0  Injury with Fall? 0  Risk for fall due to : No Fall Risks  Follow up Falls evaluation completed    Patient Care Team: Sherre Clapper, MD as PCP - General (Family Medicine)   Review of Systems  Constitutional:  Negative for appetite change, fatigue and fever.  HENT:  Negative for congestion, ear pain, sinus pressure and sore throat.   Respiratory:  Negative for cough, chest tightness, shortness of  breath and wheezing.   Cardiovascular:  Negative for chest pain and palpitations.  Gastrointestinal:  Negative for abdominal pain, constipation, diarrhea, nausea and vomiting.  Genitourinary:  Negative for dysuria and hematuria.  Musculoskeletal:  Negative for arthralgias, back pain, joint swelling and myalgias.  Skin:  Negative for rash.  Neurological:  Negative for dizziness, weakness and headaches.  Psychiatric/Behavioral:  Negative for dysphoric mood. The patient is not nervous/anxious.     Current Outpatient Medications on File Prior to Visit  Medication Sig Dispense Refill   Albuterol -Budesonide (AIRSUPRA ) 90-80 MCG/ACT AERO Inhale 2 puffs into  the lungs 4 (four) times daily as needed. 10.7 g 2   amphetamine -dextroamphetamine  (ADDERALL) 20 MG tablet Take 1 tablet (20 mg total) by mouth 2 (two) times daily. 60 tablet 0   ezetimibe  (ZETIA ) 10 MG tablet TAKE 1 TABLET BY MOUTH EVERY DAY 90 tablet 1   fluticasone  (FLONASE ) 50 MCG/ACT nasal spray Place 2 sprays into both nostrils daily. 16 g 6   montelukast  (SINGULAIR ) 10 MG tablet TAKE 1 TABLET BY MOUTH EVERYDAY AT BEDTIME 90 tablet 3   olmesartan  (BENICAR ) 5 MG tablet TAKE 1 TABLET (5 MG TOTAL) BY MOUTH DAILY. 90 tablet 0   omeprazole  (PRILOSEC) 40 MG capsule TAKE 1 CAPSULE BY MOUTH TWICE A DAY 180 capsule 3   sertraline  (ZOLOFT ) 50 MG tablet TAKE 1 TABLET BY MOUTH EVERY DAY 90 tablet 1   No current facility-administered medications on file prior to visit.   Past Medical History:  Diagnosis Date   ADHD    Asthma    Biliary dyskinesia    Depression    GERD (gastroesophageal reflux disease)    Hyperlipidemia    Hypertension    Impaired fasting glucose    Lobar pneumonia 09/23/2022   Obesity    Past Surgical History:  Procedure Laterality Date   ABDOMINAL HYSTERECTOMY  2006   patrial for uterine prolapse   HERNIA REPAIR  2009   umbilical, bilateral inguinal    Family History  Problem Relation Age of Onset   Heart attack Mother    Dementia Mother    Breast cancer Neg Hx    Social History   Socioeconomic History   Marital status: Married    Spouse name: Not on file   Number of children: 2   Years of education: Not on file   Highest education level: Bachelor's degree (e.g., BA, AB, BS)  Occupational History   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 She typically consumes wine.   Drug use: Never   Sexual activity: Yes    Partners: Male    Birth  control/protection: None  Other Topics Concern   Not on file  Social History Narrative   Not on file   Social Drivers of Health   Financial Resource Strain: Low Risk  (01/16/2024)   Overall Financial Resource Strain (CARDIA)    Difficulty of Paying Living Expenses: Not very hard  Food Insecurity: No Food Insecurity (01/16/2024)   Hunger Vital Sign    Worried About Running Out of Food in the Last Year: Never true    Ran Out of Food in the Last Year: Never true  Transportation Needs: No Transportation Needs (01/16/2024)   PRAPARE - Administrator, Civil Service (Medical):  No    Lack of Transportation (Non-Medical): No  Physical Activity: Insufficiently Active (01/16/2024)   Exercise Vital Sign    Days of Exercise per Week: 2 days    Minutes of Exercise per Session: 10 min  Stress: No Stress Concern Present (01/16/2024)   [employer] of Occupational Health - Occupational Stress Questionnaire    Feeling of Stress: Only a little  Social Connections: Socially Integrated (01/16/2024)   Social Connection and Isolation Panel    Frequency of Communication with Friends and Family: More than three times a week    Frequency of Social Gatherings with Friends and Family: Once a week    Attends Religious Services: 1 to 4 times per year    Active Member of [employer] or Organizations: Yes    Attends Engineer, structural: Patient declined    Marital Status: Married    Objective:  BP 124/82 (BP Location: Left Arm, Patient Position: Sitting)   Pulse 70   Temp 97.8 F (36.6 C) (Temporal)   Ht 5' 7 (1.702 m)   Wt 180 lb (81.6 kg)   SpO2 98%   BMI 28.19 kg/m      02/22/2024   10:23 AM 01/16/2024   10:53 AM 11/14/2023    9:03 AM  BP/Weight  Systolic BP 124 136 120  Diastolic BP 82 74 60  Wt. (Lbs) 180 177 173  BMI 28.19 kg/m2 27.72 kg/m2 27.1 kg/m2    Physical Exam Vitals reviewed.  Constitutional:      Appearance: Normal appearance. She is normal weight.  Neck:      Vascular: No carotid bruit.  Cardiovascular:     Rate and Rhythm: Normal rate and regular rhythm.     Heart sounds: Normal heart sounds.  Pulmonary:     Effort: Pulmonary effort is normal. No respiratory distress.     Breath sounds: Normal breath sounds.  Abdominal:     General: Abdomen is flat. Bowel sounds are normal.     Palpations: Abdomen is soft.     Tenderness: There is no abdominal tenderness.  Neurological:     Mental Status: She is alert and oriented to person, place, and time.  Psychiatric:        Mood and Affect: Mood normal.        Behavior: Behavior normal.         Lab Results  Component Value Date   WBC 5.5 11/14/2023   HGB 14.3 11/14/2023   HCT 42.8 11/14/2023   PLT 339 11/14/2023   GLUCOSE 113 (H) 11/14/2023   CHOL 202 (H) 11/14/2023   TRIG 110 11/14/2023   HDL 66 11/14/2023   LDLCALC 117 (H) 11/14/2023   ALT 14 11/14/2023   AST 15 11/14/2023   NA 139 11/14/2023   K 4.5 11/14/2023   CL 102 11/14/2023   CREATININE 0.61 11/14/2023   BUN 16 11/14/2023   CO2 19 (L) 11/14/2023   TSH 2.270 11/14/2023   HGBA1C 5.8 02/22/2024      Component Ref Range & Units (hover) 10:25 02/22/2024  TC 205  HDL 72  TRG 97  LDL 114  Non-HDL 133  TC/HDL 1.6           Assessment & Plan:   Assessment & Plan Moderate persistent asthma without complication - well controlled.  - Refill Trelegy prescription. - Use Airsupra  as needed, consider two puffs before walking if dyspnea occurs.  Orders:   Fluticasone -Umeclidin-Vilant (TRELEGY ELLIPTA ) 100-62.5-25 MCG/ACT AEPB; Inhale 1 puff by  mouth once daily,  Mixed hyperlipidemia Not at goal.  - Continue Zetia  10 mg daily. - Intolerant to statins. - Recommend continue to work on eating healthy diet and exercise.   Orders:   POCT Lipid Panel  Primary hypertension The current medical regimen is effective;  continue present plan and medications. Continue benicar  5 mg daily.  Continue to work on eating a  healthy diet and exercise.       Mild recurrent major depression The current medical regimen is effective;  continue present plan and medications. Continue zoloft  50 mg daily.      Overweight with body mass index (BMI) of 28 to 28.9 in adult - Increase physical activity to meet 7500-10000 steps daily. - Verify insurance coverage for [employer] or Zepbound injections.     Impaired fasting glucose Recommend continue to work on eating healthy diet and exercise. - HBA1C 5.8.  Orders:   POCT glycosylated hemoglobin (Hb A1C)   Need for vaccination  Orders:   Varicella-zoster vaccine IM   Pneumococcal conjugate vaccine 20-valent (Prevnar 20)     Body mass index is 28.19 kg/m.   Meds ordered this encounter  Medications   ibuprofen  (ADVIL ) 800 MG tablet    Sig: Take 1 tablet (800 mg total) by mouth every 8 (eight) hours as needed.    Dispense:  90 tablet    Refill:  1   Fluticasone -Umeclidin-Vilant (TRELEGY ELLIPTA ) 100-62.5-25 MCG/ACT AEPB    Sig: Inhale 1 puff by mouth once daily,    Dispense:  180 each    Refill:  3    Orders Placed This Encounter  Procedures   Varicella-zoster vaccine IM   Pneumococcal conjugate vaccine 20-valent (Prevnar 20)   POCT Lipid Panel   POCT glycosylated hemoglobin (Hb A1C)     Follow-up: Return in about 3 months (around 05/23/2024).   I,Marla I Leal-Borjas,acting as a scribe for Abigail Free, MD.,have documented all relevant documentation on the behalf of Abigail Free, MD,as directed by  Abigail Free, MD while in the presence of Abigail Free, MD.    An After Visit Summary was printed and given to the patient.  I attest that I have reviewed this visit and agree with the plan scribed by my staff.   Abigail Free, MD [facility] [phone]

## 2024-02-22 ENCOUNTER — Encounter: Payer: Self-pay | Admitting: Family Medicine

## 2024-02-22 ENCOUNTER — Ambulatory Visit: Admitting: Family Medicine

## 2024-02-22 VITALS — BP 124/82 | HR 70 | Temp 97.8°F | Ht 67.0 in | Wt 180.0 lb

## 2024-02-22 DIAGNOSIS — Z23 Encounter for immunization: Secondary | ICD-10-CM

## 2024-02-22 DIAGNOSIS — I10 Essential (primary) hypertension: Secondary | ICD-10-CM | POA: Diagnosis not present

## 2024-02-22 DIAGNOSIS — E663 Overweight: Secondary | ICD-10-CM

## 2024-02-22 DIAGNOSIS — J4540 Moderate persistent asthma, uncomplicated: Secondary | ICD-10-CM | POA: Diagnosis not present

## 2024-02-22 DIAGNOSIS — R7301 Impaired fasting glucose: Secondary | ICD-10-CM

## 2024-02-22 DIAGNOSIS — Z6828 Body mass index (BMI) 28.0-28.9, adult: Secondary | ICD-10-CM

## 2024-02-22 DIAGNOSIS — E782 Mixed hyperlipidemia: Secondary | ICD-10-CM

## 2024-02-22 DIAGNOSIS — F330 Major depressive disorder, recurrent, mild: Secondary | ICD-10-CM | POA: Diagnosis not present

## 2024-02-22 DIAGNOSIS — J4541 Moderate persistent asthma with (acute) exacerbation: Secondary | ICD-10-CM

## 2024-02-22 LAB — POCT GLYCOSYLATED HEMOGLOBIN (HGB A1C): HbA1c POC (<> result, manual entry): 5.8 % (ref 4.0–5.6)

## 2024-02-22 LAB — POCT LIPID PANEL
HDL: 72
LDL: 114
Non-HDL: 133
TC/HDL: 1.6
TC: 205
TRG: 97

## 2024-02-22 MED ORDER — TRELEGY ELLIPTA 100-62.5-25 MCG/ACT IN AEPB
INHALATION_SPRAY | RESPIRATORY_TRACT | 3 refills | Status: AC
Start: 2024-02-22 — End: ?

## 2024-02-22 MED ORDER — IBUPROFEN 800 MG PO TABS: 800.0000 mg | ORAL_TABLET | Freq: Three times a day (TID) | ORAL | 1 refills | Status: DC | PRN

## 2024-02-22 NOTE — Patient Instructions (Addendum)
  VISIT SUMMARY: You had a routine follow-up visit to review your current health status and ongoing treatments. Your blood sugar and cholesterol levels have improved, and your blood pressure is well-controlled. We discussed your asthma management, weight loss options, and preventive health measures.  YOUR PLAN: MODERATE PERSISTENT ASTHMA: Your asthma is being managed with Theatre manager. You have not experienced significant shortness of breath during walking. -Refill Trelegy prescription. -Use Air Supra as needed. Consider using two puffs before walking if you experience shortness of breath.  MIXED HYPERLIPIDEMIA: Your cholesterol levels are stable with Zetia , but we discussed the possibility of trying a low dose of Lipitor if you can tolerate it. -Continue taking Zetia  10 mg daily. -start Lipitor 10 mg daily   ESSENTIAL HYPERTENSION: Your blood pressure is well-controlled with your current medication. -Continue taking olmesartan  5 mg daily.  DEPRESSION: Your mood is stable and you are feeling happy with your current medication. -Continue taking sertraline  50 mg daily.  OVERWEIGHT: We discussed increasing your physical activity and exploring weight loss options. Previous medications were not effective. -Increase your physical activity to meet 7500-10000 steps daily. -We will verify your insurance coverage for [facility] or Zepbound injections.  GENERAL HEALTH MAINTENANCE: You are due for several vaccines and a mammogram. -Administer shingles vaccine #1 and pneumonia vaccine (prevnar 20) today. -Recommend get second shingle vaccine in 2 or more months.  -Recommend tetanus (TDAP) at future visit.  -Schedule your flu shot for October. -Schedule your mammogram.                      Contains text generated by Abridge.                                 Contains text generated by Abridge.

## 2024-02-23 DIAGNOSIS — Z6828 Body mass index (BMI) 28.0-28.9, adult: Secondary | ICD-10-CM | POA: Insufficient documentation

## 2024-02-23 NOTE — Assessment & Plan Note (Signed)
 Not at goal.  - Continue Zetia  10 mg daily. - Intolerant to statins. - Recommend continue to work on eating healthy diet and exercise.   Orders:   POCT Lipid Panel

## 2024-02-23 NOTE — Assessment & Plan Note (Signed)
-   well controlled.  - Refill Trelegy prescription. - Use Airsupra  as needed, consider two puffs before walking if dyspnea occurs.  Orders:   Fluticasone -Umeclidin-Vilant (TRELEGY ELLIPTA ) 100-62.5-25 MCG/ACT AEPB; Inhale 1 puff by mouth once daily,

## 2024-02-23 NOTE — Assessment & Plan Note (Signed)
-   Increase physical activity to meet 7500-10000 steps daily. - Verify insurance coverage for [employer] or Zeph Brown injections.

## 2024-02-27 NOTE — Assessment & Plan Note (Addendum)
 The current medical regimen is effective;  continue present plan and medications. Continue benicar  5 mg daily.  Continue to work on eating a healthy diet and exercise.

## 2024-02-27 NOTE — Assessment & Plan Note (Addendum)
 Recommend continue to work on eating healthy diet and exercise. - HBA1C 5.8.  Orders:   POCT glycosylated hemoglobin (Hb A1C)

## 2024-02-27 NOTE — Assessment & Plan Note (Addendum)
 The current medical regimen is effective;  continue present plan and medications. Continue zoloft 50 mg daily

## 2024-03-05 ENCOUNTER — Ambulatory Visit: Admitting: Family Medicine

## 2024-03-05 ENCOUNTER — Encounter: Payer: Self-pay | Admitting: Family Medicine

## 2024-03-05 VITALS — BP 110/60 | HR 74 | Temp 97.4°F | Resp 16 | Ht 67.0 in | Wt 180.0 lb

## 2024-03-05 DIAGNOSIS — J4541 Moderate persistent asthma with (acute) exacerbation: Secondary | ICD-10-CM

## 2024-03-05 DIAGNOSIS — J0101 Acute recurrent maxillary sinusitis: Secondary | ICD-10-CM | POA: Insufficient documentation

## 2024-03-05 MED ORDER — AZITHROMYCIN 250 MG PO TABS: ORAL_TABLET | ORAL | 0 refills | Status: DC

## 2024-03-05 MED ORDER — AIRSUPRA 90-80 MCG/ACT IN AERO: 2.0000 | INHALATION_SPRAY | Freq: Four times a day (QID) | RESPIRATORY_TRACT | 2 refills | Status: AC | PRN

## 2024-03-05 NOTE — Patient Instructions (Signed)
  VISIT SUMMARY: Today, you were seen for persistent respiratory symptoms that began after your recent vaccinations. You have been experiencing a productive cough, chest congestion, difficulty breathing, and fatigue for the past eleven days. You also reported trouble sleeping and episodes of panic associated with difficulty breathing. Your history includes recurrent respiratory infections and a previous diagnosis of pneumonia.  YOUR PLAN: -ACUTE UPPER RESPIRATORY TRACT INFECTION WITH PRODUCTIVE COUGH: An acute upper respiratory tract infection is a bacterial infection that affects the nose, throat, and airways, often following a viral illness. You have been prescribed a Z-Pak (azithromycin ) for five days to treat the infection. You should avoid taking Mucinex at night to prevent coughing that can disturb your sleep. Additionally, using humidified air, drinking hot teas with honey and lemon, and continuing with your Air Supra inhaler will help manage your symptoms. Please monitor for any signs of a yeast infection or gastrointestinal upset while taking the antibiotic.  INSTRUCTIONS: Please follow the prescribed treatment plan and monitor your symptoms. If you experience any worsening of symptoms or side effects from the medication, contact our office. Ensure you complete the full course of the Z-Pak. If your symptoms do not improve or if you have any concerns, schedule a follow-up appointment.                      Contains text generated by Abridge.                                 Contains text generated by Abridge.

## 2024-03-05 NOTE — Progress Notes (Signed)
 Acute Office Visit  Subjective:    Patient ID: Brittany Walton, female    DOB: Dec 01, 1960, 63 y.o.   MRN: [id number]  Chief Complaint  Patient presents with   Cough   Chest congestion   Discussed the use of AI scribe software for clinical note transcription with the patient, who gave verbal consent to proceed.  History of Present Illness   Brittany Walton is a 63 year old female who presents with persistent respiratory symptoms following vaccinations.  Respiratory symptoms - Persistent cough for eleven days, productive of green sputum - Chest congestion - Difficulty breathing - Sensation of 'pop, crack' in the lungs with inspiration - History of recurrent respiratory infections - History of pneumonia diagnosed by chest x-ray approximately one year ago; no recent chest x-ray performed - No chest pain, shortness of breath, or palpitations  Fatigue and general malaise - Significant fatigue for eleven days, initially unable to get out of bed - Lack of energy - Lightheadedness  Sleep disturbance and anxiety - Trouble sleeping - Episodes of panic associated with dyspnea  Recent vaccinations - Onset of symptoms following shingles and pneumonia vaccinations  Current and prior treatments - Mucinex (night and day formulation) every four hours, completed one and a half boxes, with some relief of congestion - Nebulizer with albuterol  - Recently switched to [employer] (combination steroid and albuterol ) - Previous treatments for respiratory infections include breathing treatments, antibiotics, and prednisone       Past Medical History:  Diagnosis Date   ADHD    Asthma    Biliary dyskinesia    Depression    GERD (gastroesophageal reflux disease)    Hyperlipidemia    Hypertension    Impaired fasting glucose    Lobar pneumonia 09/23/2022   Obesity     Past Surgical History:  Procedure Laterality Date   ABDOMINAL HYSTERECTOMY  2006   patrial for uterine prolapse   HERNIA  REPAIR  2009   umbilical, bilateral inguinal    Family History  Problem Relation Age of Onset   Heart attack Mother    Dementia Mother    Breast cancer Neg Hx     Social History   Socioeconomic History   Marital status: Married    Spouse name: Not on file   Number of children: 2   Years of education: Not on file   Highest education level: Bachelor's degree (e.g., BA, AB, BS)  Occupational History   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 She typically consumes wine.   Drug use: Never   Sexual activity: Yes    Partners: Male    Birth control/protection: None  Other Topics Concern   Not on file  Social History Narrative   Not on file   Social Drivers of Health   Financial Resource Strain: Low Risk  (01/16/2024)   Overall Financial Resource Strain (CARDIA)    Difficulty of Paying Living Expenses: Not very hard  Food Insecurity: No Food Insecurity (01/16/2024)   Hunger Vital Sign    Worried About Running Out of Food in the Last Year: Never true    Ran Out of Food in the Last Year: Never true  Transportation Needs: No Transportation Needs (01/16/2024)   PRAPARE - Transportation    Lack of  Transportation (Medical): No    Lack of Transportation (Non-Medical): No  Physical Activity: Insufficiently Active (01/16/2024)   Exercise Vital Sign    Days of Exercise per Week: 2 days    Minutes of Exercise per Session: 10 min  Stress: No Stress Concern Present (01/16/2024)   [employer] of Occupational Health - Occupational Stress Questionnaire    Feeling of Stress: Only a little  Social Connections: Socially Integrated (01/16/2024)   Social Connection and Isolation Panel    Frequency of Communication with Friends and Family: More than three times a week    Frequency of  Social Gatherings with Friends and Family: Once a week    Attends Religious Services: 1 to 4 times per year    Active Member of [employer] or Organizations: Yes    Attends Banker Meetings: Patient declined    Marital Status: Married  Catering manager Violence: Not At Risk (03/15/2023)   Humiliation, Afraid, Rape, and Kick questionnaire    Fear of Current or Ex-Partner: No    Emotionally Abused: No    Physically Abused: No    Sexually Abused: No    Outpatient Medications Prior to Visit  Medication Sig Dispense Refill   amphetamine -dextroamphetamine  (ADDERALL) 20 MG tablet Take 1 tablet (20 mg total) by mouth 2 (two) times daily. 60 tablet 0   ezetimibe  (ZETIA ) 10 MG tablet TAKE 1 TABLET BY MOUTH EVERY DAY 90 tablet 1   fluticasone  (FLONASE ) 50 MCG/ACT nasal spray Place 2 sprays into both nostrils daily. 16 g 6   Fluticasone -Umeclidin-Vilant (TRELEGY ELLIPTA ) 100-62.5-25 MCG/ACT AEPB Inhale 1 puff by mouth once daily, 180 each 3   ibuprofen  (ADVIL ) 800 MG tablet Take 1 tablet (800 mg total) by mouth every 8 (eight) hours as needed. 90 tablet 1   montelukast  (SINGULAIR ) 10 MG tablet TAKE 1 TABLET BY MOUTH EVERYDAY AT BEDTIME 90 tablet 3   olmesartan  (BENICAR ) 5 MG tablet TAKE 1 TABLET (5 MG TOTAL) BY MOUTH DAILY. 90 tablet 0   omeprazole  (PRILOSEC) 40 MG capsule TAKE 1 CAPSULE BY MOUTH TWICE A DAY 180 capsule 3   sertraline  (ZOLOFT ) 50 MG tablet TAKE 1 TABLET BY MOUTH EVERY DAY 90 tablet 1   Albuterol -Budesonide (AIRSUPRA ) 90-80 MCG/ACT AERO Inhale 2 puffs into the lungs 4 (four) times daily as needed. 10.7 g 2   No facility-administered medications prior to visit.    Allergies  Allergen Reactions   Atorvastatin Other (See Comments)    Muscle cramps  Other Reaction(s): Other (See Comments)    Muscle cramps   Codeine    Crestor [Rosuvastatin] Other (See Comments)    Myalgias   Diphenhydramine Hcl    Elemental Sulfur    Sulfur     Review of Systems  Constitutional:   Positive for fatigue. Negative for chills and fever.  HENT:  Positive for congestion, postnasal drip and sore throat. Negative for ear pain.   Respiratory:  Positive for cough, chest tightness, shortness of breath and wheezing.   Cardiovascular:  Negative for chest pain and palpitations.  Gastrointestinal:  Negative for abdominal pain, anal bleeding, constipation, diarrhea, nausea and vomiting.  Endocrine: Negative for polydipsia, polyphagia and polyuria.  Genitourinary:  Negative for difficulty urinating and dysuria.  Musculoskeletal:  Negative for arthralgias, back pain and myalgias.  Skin:  Negative for rash.  Neurological:  Negative for dizziness, light-headedness and headaches.  Psychiatric/Behavioral:  Positive for sleep disturbance. Negative for dysphoric mood. The patient is not nervous/anxious.  Objective:        03/05/2024    9:27 AM 02/22/2024   10:23 AM 01/16/2024   10:53 AM  Vitals with BMI  Height 5' 7 5' 7 5' 7  Weight 180 lbs 180 lbs 177 lbs  BMI 28.19 28.19 27.72  Systolic 110 124 863  Diastolic 60 82 74  Pulse 74 70 76    No data found.   Physical Exam Vitals reviewed.  Constitutional:      General: She is not in acute distress.    Appearance: Normal appearance. She is ill-appearing.  HENT:     Nose: Congestion present.     Right Turbinates: Swollen.     Left Turbinates: Swollen.     Right Sinus: Maxillary sinus tenderness present.     Left Sinus: Maxillary sinus tenderness present.     Mouth/Throat:     Pharynx: Posterior oropharyngeal erythema present.  Eyes:     Conjunctiva/sclera: Conjunctivae normal.  Cardiovascular:     Rate and Rhythm: Normal rate and regular rhythm.     Heart sounds: Normal heart sounds. No murmur heard. Pulmonary:     Effort: Pulmonary effort is normal.     Breath sounds: Normal breath sounds. No wheezing.  Abdominal:     General: Bowel sounds are normal.     Palpations: Abdomen is soft.  Musculoskeletal:         General: Normal range of motion.  Skin:    General: Skin is warm.  Neurological:     Mental Status: She is alert. Mental status is at baseline.  Psychiatric:        Mood and Affect: Mood normal.        Behavior: Behavior normal.     Health Maintenance Due  Topic Date Due   HIV Screening  Never done   DTaP/Tdap/Td (2 - Td or Tdap) 02/06/2021   Mammogram  09/01/2023   COVID-19 Vaccine (5 - 2025-26 season) 02/04/2024    There are no preventive care reminders to display for this patient.   Lab Results  Component Value Date   TSH 2.270 11/14/2023   Lab Results  Component Value Date   WBC 5.5 11/14/2023   HGB 14.3 11/14/2023   HCT 42.8 11/14/2023   MCV 92 11/14/2023   PLT 339 11/14/2023   Lab Results  Component Value Date   NA 139 11/14/2023   K 4.5 11/14/2023   CO2 19 (L) 11/14/2023   GLUCOSE 113 (H) 11/14/2023   BUN 16 11/14/2023   CREATININE 0.61 11/14/2023   BILITOT <0.2 11/14/2023   ALKPHOS 85 11/14/2023   AST 15 11/14/2023   ALT 14 11/14/2023   PROT 6.9 11/14/2023   ALBUMIN 4.6 11/14/2023   CALCIUM 9.4 11/14/2023   EGFR 100 11/14/2023   Lab Results  Component Value Date   CHOL 202 (H) 11/14/2023   Lab Results  Component Value Date   HDL 66 11/14/2023   Lab Results  Component Value Date   LDLCALC 117 (H) 11/14/2023   Lab Results  Component Value Date   TRIG 110 11/14/2023   Lab Results  Component Value Date   CHOLHDL 3.1 11/14/2023   Lab Results  Component Value Date   HGBA1C 5.8 02/22/2024        Results for orders placed or performed in visit on 02/22/24  POCT Lipid Panel   Collection Time: 02/22/24 10:25 AM  Result Value Ref Range   TC 205    HDL 72  TRG 97    LDL 114    Non-HDL 133    TC/HDL 1.6   POCT glycosylated hemoglobin (Hb A1C)   Collection Time: 02/22/24 10:25 AM  Result Value Ref Range   Hemoglobin A1C     HbA1c POC (<> result, manual entry) 5.8 4.0 - 5.6 %   HbA1c, POC (prediabetic range)     HbA1c, POC  (controlled diabetic range)       Assessment & Plan:   Assessment & Plan Acute recurrent maxillary sinusitis Acute sinusitis with productive cough, likely bacterial following viral illness. Lungs clear, indicating improvement. Prefers Z-Pak due to previous effectiveness and tolerability. - Prescribed Z-Pak (azithromycin ) for five days. - Advised against Mucinex at night to prevent nocturnal coughing. - Encouraged use of humidified air, hot teas, honey, and lemon. - Monitor for yeast infection or GI upset with antibiotic use.  Orders:   azithromycin  (ZITHROMAX ) 250 MG tablet; 2 DAILY FOR FIRST DAY, THEN DECREASE TO ONE DAILY FOR 4 MORE DAYS.  Moderate persistent asthma with exacerbation Well controlled - Continue Trelegy once daily and Airsupra  as needed - Refilled Air Supra inhaler. Orders:   Albuterol -Budesonide (AIRSUPRA ) 90-80 MCG/ACT AERO; Inhale 2 puffs into the lungs 4 (four) times daily as needed.    Meds ordered this encounter  Medications   azithromycin  (ZITHROMAX ) 250 MG tablet    Sig: 2 DAILY FOR FIRST DAY, THEN DECREASE TO ONE DAILY FOR 4 MORE DAYS.    Dispense:  6 tablet    Refill:  0   Albuterol -Budesonide (AIRSUPRA ) 90-80 MCG/ACT AERO    Sig: Inhale 2 puffs into the lungs 4 (four) times daily as needed.    Dispense:  10.7 g    Refill:  2    No orders of the defined types were placed in this encounter.    Follow-up: Return if symptoms worsen or fail to improve.  An After Visit Summary was printed and given to the patient.  Harrie Cedar, FNP [facility] [phone]

## 2024-03-05 NOTE — Assessment & Plan Note (Addendum)
 Well controlled - Continue Trelegy once daily and Airsupra  as needed - Refilled Air Supra inhaler. Orders:   Albuterol -Budesonide (AIRSUPRA ) 90-80 MCG/ACT AERO; Inhale 2 puffs into the lungs 4 (four) times daily as needed.

## 2024-03-05 NOTE — Assessment & Plan Note (Addendum)
 Acute sinusitis with productive cough, likely bacterial following viral illness. Lungs clear, indicating improvement. Prefers Z-Pak due to previous effectiveness and tolerability. - Prescribed Z-Pak (azithromycin ) for five days. - Advised against Mucinex at night to prevent nocturnal coughing. - Encouraged use of humidified air, hot teas, honey, and lemon. - Monitor for yeast infection or GI upset with antibiotic use.  Orders:   azithromycin  (ZITHROMAX ) 250 MG tablet; 2 DAILY FOR FIRST DAY, THEN DECREASE TO ONE DAILY FOR 4 MORE DAYS.

## 2024-03-07 ENCOUNTER — Encounter: Payer: Self-pay | Admitting: Obstetrics and Gynecology

## 2024-03-07 ENCOUNTER — Ambulatory Visit: Admitting: Obstetrics and Gynecology

## 2024-03-07 VITALS — BP 134/85 | HR 73 | Ht 66.0 in | Wt 177.3 lb

## 2024-03-07 DIAGNOSIS — R350 Frequency of micturition: Secondary | ICD-10-CM | POA: Diagnosis not present

## 2024-03-07 DIAGNOSIS — N816 Rectocele: Secondary | ICD-10-CM

## 2024-03-07 DIAGNOSIS — N393 Stress incontinence (female) (male): Secondary | ICD-10-CM | POA: Diagnosis not present

## 2024-03-07 DIAGNOSIS — R3915 Urgency of urination: Secondary | ICD-10-CM | POA: Diagnosis not present

## 2024-03-07 LAB — POCT URINALYSIS DIP (CLINITEK)
Bilirubin, UA: NEGATIVE
Blood, UA: NEGATIVE
Glucose, UA: NEGATIVE mg/dL
Ketones, POC UA: NEGATIVE mg/dL
Leukocytes, UA: NEGATIVE
Nitrite, UA: NEGATIVE
POC PROTEIN,UA: NEGATIVE
Spec Grav, UA: 1.02 (ref 1.010–1.025)
Urobilinogen, UA: 0.2 U/dL
pH, UA: 6.5 (ref 5.0–8.0)

## 2024-03-07 NOTE — Assessment & Plan Note (Signed)
-   For treatment of stress urinary incontinence,  non-surgical options include expectant management, weight loss, physical therapy, as well as a pessary.  Surgical options include a midurethral sling, and transurethral injection of a bulking agent. - She will try a pessary

## 2024-03-07 NOTE — Assessment & Plan Note (Signed)
 Stage 0 anterior, Stage II posterior, Stage I apical prolapse - For treatment of pelvic organ prolapse, we discussed options for management including expectant management, conservative management, and surgical management, such as Kegels, a pessary, pelvic floor physical therapy, and specific surgical procedures. - She is interested in a pessary. Will have her return for a fitting. She may be interested in surgery at a future time, we discussed the option of posterior repair with SSLF.

## 2024-03-07 NOTE — Patient Instructions (Signed)

## 2024-03-07 NOTE — Progress Notes (Signed)
 New Patient Evaluation and Consultation  Referring Provider: Sherre Clapper, MD PCP: Sherre Clapper, MD Date of Service: 03/07/2024  SUBJECTIVE Chief Complaint: New Patient (Initial Visit) Brittany Walton is a 63 y.o. female is here for Cystocele with prolapse.)  History of Present Illness: Brittany Walton is a 63 y.o. White or Caucasian female seen in consultation at the request of Dr Cox for evaluation of prolapse.     Urinary Symptoms: Leaks urine with cough/ sneeze Not happening every day. More often now with URI. Sometimes has urgency but not usually leakage.   Not wearing pads.  Leakage is bothersome to her.   Day time voids 7-8.  Nocturia: 1-2 times per night to void. Voiding dysfunction:  does not empty bladder well.  Patient does not use a catheter to empty bladder.  When urinating, patient feels a weak stream and dribbling after finishing Drinks: 2 cups coffee, 1-2 cans diet coke, water, zero sugar fruit drinks per day  UTIs: 0 UTI's in the last year.   Denies history of blood in urine and kidney or bladder stones   Pelvic Organ Prolapse Symptoms:                  Patient Admits to a feeling of a bulge the vaginal area. It has been present for 8 months.  Patient Admits to seeing a bulge.  This bulge is bothersome. Had a prolapsed uterus and had a vaginal hysterectomy about 20 years ago.   Bowel Symptom: Bowel movements: 1-2 time(s) per day Stool consistency: soft  Straining: no.  Splinting: no.  Incomplete evacuation: no.  Patient Denies accidental bowel leakage / fecal incontinence Bowel regimen: none  HM Colonoscopy          Upcoming     Colonoscopy (Every 10 Years) Next due on 02/18/2025    02/19/2015  HM Colonoscopy component of HM COLONOSCOPY   Only the first 1 history entries have been loaded, but more history exists.                Sexual Function Sexually active: yes.  Sexual orientation: heterosexual Pain with sex: No  Pelvic  Pain Denies pelvic pain    Past Medical History:  Past Medical History:  Diagnosis Date   ADHD    Asthma    Biliary dyskinesia    Depression    GERD (gastroesophageal reflux disease)    Hyperlipidemia    Hypertension    Impaired fasting glucose    Lobar pneumonia 09/23/2022   Obesity    Prediabetes      Past Surgical History:   Past Surgical History:  Procedure Laterality Date   CHOLECYSTECTOMY  2020   HERNIA REPAIR  2009   umbilical, bilateral inguinal   TONSILLECTOMY     VAGINAL HYSTERECTOMY  2006   for uterine prolapse     Past OB/GYN History: OB History  Gravida Para Term Preterm AB Living  2 2 2   2   SAB IAB Ectopic Multiple Live Births          # Outcome Date GA Lbr Len/2nd Weight Sex Type Anes PTL Lv  2 Term      Vag-Spont     1 Term      Vag-Spont       S/p hysterectomy  Medications: Patient has a current medication list which includes the following prescription(s): airsupra , amphetamine -dextroamphetamine , azithromycin , ezetimibe , fluticasone , trelegy ellipta , ibuprofen , montelukast , olmesartan , omeprazole , and sertraline .   Allergies: Patient is allergic to  atorvastatin, codeine, crestor [rosuvastatin], diphenhydramine hcl, elemental sulfur, and sulfur.   Social History:  Social History   Tobacco Use   Smoking status: Never   Smokeless tobacco: Never  Vaping Use   Vaping status: Never Used  Substance Use Topics   Alcohol use: Yes    Comment: Drinks alcohol very infrequently. She typically consumes wine.   Drug use: Never    Relationship status: married Patient lives with her husband.   Patient is not employed. Regular exercise: Yes: daily walk History of abuse: No  Family History:   Family History  Problem Relation Age of Onset   Diabetes Mother    Heart attack Mother    Dementia Mother    Heart disease Father    Breast cancer Neg Hx      Review of Systems: Review of Systems  Constitutional:  Positive for malaise/fatigue.  Negative for fever and weight loss.  Respiratory:  Negative for cough, shortness of breath and wheezing.   Cardiovascular:  Negative for chest pain, palpitations and leg swelling.  Gastrointestinal:  Negative for abdominal pain and blood in stool.  Genitourinary:  Negative for dysuria.  Musculoskeletal:  Negative for myalgias.  Skin:  Negative for rash.  Neurological:  Negative for dizziness and headaches.  Endo/Heme/Allergies:  Does not bruise/bleed easily.  Psychiatric/Behavioral:  Negative for depression. The patient is not nervous/anxious.      OBJECTIVE Physical Exam: Vitals:   03/07/24 1312  BP: 134/85  Pulse: 73  Weight: 177 lb 4.8 oz (80.4 kg)  Height: 5' 6 (1.676 m)    Physical Exam Vitals reviewed. Exam conducted with a chaperone present.  Constitutional:      General: She is not in acute distress. Pulmonary:     Effort: Pulmonary effort is normal.  Abdominal:     General: There is no distension.     Palpations: Abdomen is soft.     Tenderness: There is no abdominal tenderness. There is no rebound.  Musculoskeletal:        General: No swelling. Normal range of motion.  Skin:    General: Skin is warm and dry.     Findings: No rash.  Neurological:     Mental Status: She is alert and oriented to person, place, and time.  Psychiatric:        Mood and Affect: Mood normal.        Behavior: Behavior normal.      GU / Detailed Urogynecologic Evaluation:  Pelvic Exam: Normal external female genitalia; Bartholin's and Skene's glands normal in appearance; urethral meatus normal in appearance, no urethral masses or discharge.   CST: positive  s/p hysterectomy: Speculum exam reveals normal vaginal mucosa with  atrophy and normal vaginal cuff.  Adnexa no mass, fullness, tenderness.    Pelvic floor strength I/V, puborectalis IV/V external anal sphincter IV/V  Pelvic floor musculature: Right levator non-tender, Right obturator non-tender, Left levator non-tender,  Left obturator non-tender  POP-Q:   POP-Q  -3                                            Aa   -3  Ba  -5.5                                              C   6                                            Gh  5.5                                            Pb  8                                            tvl   0                                            Ap  0                                            Bp                                                 D      Rectal Exam:  Normal sphincter tone, large distal rectocele, enterocoele present, no rectal masses, no sign of dyssynergia when asking the patient to bear down.  Post-Void Residual (PVR) by Bladder Scan: In order to evaluate bladder emptying, we discussed obtaining a postvoid residual and patient agreed to this procedure.  Procedure: The ultrasound unit was placed on the patient's abdomen in the suprapubic region after the patient had voided.    Post Void Residual - 03/07/24 1337       Post Void Residual   Post Void Residual 48 mL           Laboratory Results: Lab Results  Component Value Date   COLORU yellow 03/07/2024   CLARITYU clear 03/07/2024   GLUCOSEUR negative 03/07/2024   BILIRUBINUR negative 03/07/2024   SPECGRAV 1.020 03/07/2024   RBCUR negative 03/07/2024   PHUR 6.5 03/07/2024   UROBILINOGEN 0.2 03/07/2024   LEUKOCYTESUR Negative 03/07/2024    Lab Results  Component Value Date   CREATININE 0.61 11/14/2023   CREATININE 0.69 06/29/2023   CREATININE 0.66 03/15/2023    Lab Results  Component Value Date   HGBA1C 5.8 02/22/2024    Lab Results  Component Value Date   HGB 14.3 11/14/2023     ASSESSMENT AND PLAN Brittany Walton is a 63 y.o. with:  1. Prolapse of posterior vaginal wall   2. SUI (stress urinary incontinence, female)   3. Urinary urgency   4. Urinary frequency     Prolapse of posterior vaginal wall Assessment & Plan: Stage 0  anterior,  Stage II posterior, Stage I apical prolapse - For treatment of pelvic organ prolapse, we discussed options for management including expectant management, conservative management, and surgical management, such as Kegels, a pessary, pelvic floor physical therapy, and specific surgical procedures. - She is interested in a pessary. Will have her return for a fitting. She may be interested in surgery at a future time, we discussed the option of posterior repair with SSLF.    SUI (stress urinary incontinence, female) Assessment & Plan: - For treatment of stress urinary incontinence,  non-surgical options include expectant management, weight loss, physical therapy, as well as a pessary.  Surgical options include a midurethral sling, and transurethral injection of a bulking agent. - She will try a pessary   Urinary urgency Assessment & Plan: - Discussed reducing bladder irritants such as coffee and tea.    Urinary frequency -     POCT URINALYSIS DIP (CLINITEK)   Return for pessary fitting  Rosaline LOISE Caper, MD

## 2024-03-07 NOTE — Assessment & Plan Note (Signed)
-   Discussed reducing bladder irritants such as coffee and tea.

## 2024-03-20 ENCOUNTER — Other Ambulatory Visit: Payer: Self-pay | Admitting: Family Medicine

## 2024-03-20 DIAGNOSIS — J4540 Moderate persistent asthma, uncomplicated: Secondary | ICD-10-CM

## 2024-03-21 ENCOUNTER — Ambulatory Visit (INDEPENDENT_AMBULATORY_CARE_PROVIDER_SITE_OTHER)

## 2024-03-21 ENCOUNTER — Ambulatory Visit
Admission: RE | Admit: 2024-03-21 | Discharge: 2024-03-21 | Disposition: A | Discharge status: Home / Self Care | Source: Ambulatory Visit | Attending: Family Medicine | Admitting: Family Medicine

## 2024-03-21 DIAGNOSIS — Z1231 Encounter for screening mammogram for malignant neoplasm of breast: Secondary | ICD-10-CM

## 2024-03-21 DIAGNOSIS — Z23 Encounter for immunization: Secondary | ICD-10-CM | POA: Diagnosis not present

## 2024-03-21 DIAGNOSIS — J4541 Moderate persistent asthma with (acute) exacerbation: Secondary | ICD-10-CM

## 2024-03-26 ENCOUNTER — Ambulatory Visit: Payer: Self-pay | Admitting: Family Medicine

## 2024-04-10 ENCOUNTER — Other Ambulatory Visit: Payer: Self-pay | Admitting: Family Medicine

## 2024-04-10 DIAGNOSIS — F900 Attention-deficit hyperactivity disorder, predominantly inattentive type: Secondary | ICD-10-CM

## 2024-04-10 MED ORDER — AMPHETAMINE-DEXTROAMPHETAMINE 20 MG PO TABS: 20.0000 mg | ORAL_TABLET | Freq: Two times a day (BID) | ORAL | 0 refills | Status: DC

## 2024-04-10 NOTE — Telephone Encounter (Signed)
 Copied from CRM #[id number]. Topic: Clinical - Medication Refill >> Apr 10, 2024 12:19 PM Nathanel BROCKS wrote: Medication: amphetamine -dextroamphetamine  (ADDERALL) 20 MG tablet  Has the patient contacted their pharmacy? No  This is the patient's preferred pharmacy:  [facility] [address], [place] - [address] [place] [address] Phone: [phone] Fax: [phone]  Is this the correct pharmacy for this prescription? Yes If no, delete pharmacy and type the correct one.   Has the prescription been filled recently? Yes  Is the patient out of the medication? Yes  Has the patient been seen for an appointment in the last year OR does the patient have an upcoming appointment? Yes  Can we respond through MyChart? Yes  Agent: Please be advised that Rx refills may take up to 3 business days. We ask that you follow-up with your pharmacy.

## 2024-04-23 ENCOUNTER — Ambulatory Visit: Admitting: Obstetrics and Gynecology

## 2024-04-23 ENCOUNTER — Encounter: Payer: Self-pay | Admitting: Obstetrics and Gynecology

## 2024-04-23 VITALS — BP 133/86 | HR 77 | Ht 66.0 in | Wt 177.0 lb

## 2024-04-23 DIAGNOSIS — N952 Postmenopausal atrophic vaginitis: Secondary | ICD-10-CM

## 2024-04-23 DIAGNOSIS — N812 Incomplete uterovaginal prolapse: Secondary | ICD-10-CM

## 2024-04-23 DIAGNOSIS — Z466 Encounter for fitting and adjustment of urinary device: Secondary | ICD-10-CM

## 2024-04-23 DIAGNOSIS — N393 Stress incontinence (female) (male): Secondary | ICD-10-CM

## 2024-04-23 DIAGNOSIS — N816 Rectocele: Secondary | ICD-10-CM

## 2024-04-23 MED ORDER — ESTRADIOL 0.01 % VA CREA
0.5000 g | TOPICAL_CREAM | VAGINAL | 11 refills | Status: AC
Start: 2024-04-24 — End: ?

## 2024-04-23 NOTE — Progress Notes (Signed)
 [facility]   Subjective:     Chief Complaint: Pessary Fitting  History of Present Illness: Brittany Walton is a 63 y.o. female with stage II pelvic organ prolapse and stress incontinence who presents today for a pessary fitting.    Past Medical History: Patient  has a past medical history of ADHD, Asthma, Biliary dyskinesia, Depression, GERD (gastroesophageal reflux disease), Hyperlipidemia, Hypertension, Impaired fasting glucose, Lobar pneumonia (09/23/2022), Obesity, and Prediabetes.   Past Surgical History: She  has a past surgical history that includes Vaginal hysterectomy (2006); Hernia repair (2009); Tonsillectomy; and Cholecystectomy (2020).   Medications: She has a current medication list which includes the following prescription(s): airsupra , amphetamine -dextroamphetamine , [START ON 04/24/2024] estradiol, ezetimibe , fluticasone , trelegy ellipta , ibuprofen , montelukast , olmesartan , omeprazole , and sertraline .   Allergies: Patient is allergic to atorvastatin, codeine, crestor [rosuvastatin], diphenhydramine hcl, elemental sulfur, and sulfur.   Social History: Patient  reports that she has never smoked. She has never used smokeless tobacco. She reports current alcohol use. She reports that she does not use drugs.      Objective:    BP 133/86   Pulse 77   Ht 5' 6 (1.676 m)   Wt 177 lb (80.3 kg)   BMI 28.57 kg/m  Gen: No apparent distress, A&O x 3. Pelvic Exam: Normal external female genitalia; Bartholin's and Skene's glands normal in appearance; urethral meatus normal in appearance, no urethral masses or discharge.   Attempted multiple sizes of Incontinence dishes, but they were all pushed out with Valsalva.   A size #3 cube ([id number]) pessary was fitted. It was comfortable, stayed in place with valsalva and was an appropriate size on examination, with one finger fitting between the pessary and the vaginal walls.The string attached to the Cube pessary  made it so the patient could remove and replace. Patient was able to demonstrate this during visit.    Assessment/Plan:    Assessment: Brittany Walton is a 63 y.o. with stage II pelvic organ prolapse who presents for a pessary fitting. Plan: She was fitted with a #3 cube pessary. She will remove once a week. She will use estrogen.   Follow-up in 6 weeks for a pessary check or sooner as needed.  All questions were answered.    Evania Lyne G Lashaun Poch, NP

## 2024-04-23 NOTE — Patient Instructions (Signed)
 For the pessary, take it out at least once a week or for intercourse. Wash the pessary with warm water and a gentle soap (Dove or cetaphil)  Use lubrication or estrogen cream to insert  Use the estrogen cream nightly for 2 weeks and then twice weekly after. You want to get this inside the vagina as high as possible for tissue support.

## 2024-05-16 ENCOUNTER — Other Ambulatory Visit: Payer: Self-pay | Admitting: Family Medicine

## 2024-05-16 DIAGNOSIS — I10 Essential (primary) hypertension: Secondary | ICD-10-CM

## 2024-05-20 ENCOUNTER — Other Ambulatory Visit: Payer: Self-pay | Admitting: Family Medicine

## 2024-05-20 DIAGNOSIS — F900 Attention-deficit hyperactivity disorder, predominantly inattentive type: Secondary | ICD-10-CM

## 2024-05-20 MED ORDER — AMPHETAMINE-DEXTROAMPHETAMINE 20 MG PO TABS: 20.0000 mg | ORAL_TABLET | Freq: Two times a day (BID) | ORAL | 0 refills | Status: DC

## 2024-05-20 NOTE — Telephone Encounter (Signed)
 Copied from CRM #[id number]. Topic: Clinical - Medication Refill >> May 20, 2024  2:29 PM Carla L wrote:  Medication: amphetamine -dextroamphetamine  (ADDERALL) 20 MG tablet  Has the patient contacted their pharmacy? No  This is the patient's preferred pharmacy:  [facility] [address] [place] - [address] [place] [address] Phone: [phone] Fax: [phone]  Is this the correct pharmacy for this prescription? Yes  Has the prescription been filled recently? No  Is the patient out of the medication? No  Has the patient been seen for an appointment in the last year OR does the patient have an upcoming appointment? Yes  Can we respond through MyChart? No  Agent: Please be advised that Rx refills may take up to 3 business days. We ask that you follow-up with your pharmacy.

## 2024-05-26 ENCOUNTER — Ambulatory Visit: Admitting: Family Medicine

## 2024-06-10 ENCOUNTER — Ambulatory Visit: Admitting: Obstetrics and Gynecology

## 2024-06-18 ENCOUNTER — Ambulatory Visit: Admitting: Physician Assistant

## 2024-06-18 ENCOUNTER — Encounter: Payer: Self-pay | Admitting: Physician Assistant

## 2024-06-18 VITALS — BP 152/98 | HR 77 | Temp 97.8°F | Ht 66.0 in | Wt 185.0 lb

## 2024-06-18 DIAGNOSIS — F330 Major depressive disorder, recurrent, mild: Secondary | ICD-10-CM

## 2024-06-18 DIAGNOSIS — E6609 Other obesity due to excess calories: Secondary | ICD-10-CM

## 2024-06-18 DIAGNOSIS — I10 Essential (primary) hypertension: Secondary | ICD-10-CM

## 2024-06-18 DIAGNOSIS — K219 Gastro-esophageal reflux disease without esophagitis: Secondary | ICD-10-CM | POA: Diagnosis not present

## 2024-06-18 DIAGNOSIS — M791 Myalgia, unspecified site: Secondary | ICD-10-CM

## 2024-06-18 DIAGNOSIS — F900 Attention-deficit hyperactivity disorder, predominantly inattentive type: Secondary | ICD-10-CM

## 2024-06-18 DIAGNOSIS — Z23 Encounter for immunization: Secondary | ICD-10-CM | POA: Diagnosis not present

## 2024-06-18 DIAGNOSIS — R7301 Impaired fasting glucose: Secondary | ICD-10-CM | POA: Diagnosis not present

## 2024-06-18 DIAGNOSIS — E782 Mixed hyperlipidemia: Secondary | ICD-10-CM

## 2024-06-18 DIAGNOSIS — J4540 Moderate persistent asthma, uncomplicated: Secondary | ICD-10-CM

## 2024-06-18 LAB — CBC WITH DIFFERENTIAL/PLATELET
Basophils Absolute: 0.1 x10E3/uL (ref 0.0–0.2)
Basos: 1 %
EOS (ABSOLUTE): 0.3 x10E3/uL (ref 0.0–0.4)
Eos: 4 %
Hematocrit: 44.5 % (ref 34.0–46.6)
Hemoglobin: 14.4 g/dL (ref 11.1–15.9)
Immature Grans (Abs): 0 x10E3/uL (ref 0.0–0.1)
Immature Granulocytes: 0 %
Lymphocytes Absolute: 2.2 x10E3/uL (ref 0.7–3.1)
Lymphs: 29 %
MCH: 29.1 pg (ref 26.6–33.0)
MCHC: 32.4 g/dL (ref 31.5–35.7)
MCV: 90 fL (ref 79–97)
Monocytes Absolute: 0.6 x10E3/uL (ref 0.1–0.9)
Monocytes: 8 %
Neutrophils Absolute: 4.2 x10E3/uL (ref 1.4–7.0)
Neutrophils: 58 %
Platelets: 368 x10E3/uL (ref 150–450)
RBC: 4.94 x10E6/uL (ref 3.77–5.28)
RDW: 13.9 % (ref 11.7–15.4)
WBC: 7.4 x10E3/uL (ref 3.4–10.8)

## 2024-06-18 LAB — LIPID PANEL
Chol/HDL Ratio: 3.2 ratio (ref 0.0–4.4)
Cholesterol, Total: 242 mg/dL — ABNORMAL HIGH (ref 100–199)
HDL: 75 mg/dL
LDL Chol Calc (NIH): 136 mg/dL — ABNORMAL HIGH (ref 0–99)
Triglycerides: 177 mg/dL — ABNORMAL HIGH (ref 0–149)
VLDL Cholesterol Cal: 31 mg/dL (ref 5–40)

## 2024-06-18 LAB — HEMOGLOBIN A1C
Est. average glucose Bld gHb Est-mCnc: 128 mg/dL
Hgb A1c MFr Bld: 6.1 % — ABNORMAL HIGH (ref 4.8–5.6)

## 2024-06-18 LAB — COMPREHENSIVE METABOLIC PANEL WITH GFR
ALT: 27 IU/L (ref 0–32)
AST: 23 IU/L (ref 0–40)
Albumin: 4.7 g/dL (ref 3.9–4.9)
Alkaline Phosphatase: 101 IU/L (ref 49–135)
BUN/Creatinine Ratio: 16 (ref 12–28)
BUN: 9 mg/dL (ref 8–27)
Bilirubin Total: 0.3 mg/dL (ref 0.0–1.2)
CO2: 24 mmol/L (ref 20–29)
Calcium: 9.8 mg/dL (ref 8.7–10.3)
Chloride: 96 mmol/L (ref 96–106)
Creatinine, Ser: 0.55 mg/dL — ABNORMAL LOW (ref 0.57–1.00)
Globulin, Total: 2.6 g/dL (ref 1.5–4.5)
Glucose: 103 mg/dL — ABNORMAL HIGH (ref 70–99)
Potassium: 4.9 mmol/L (ref 3.5–5.2)
Sodium: 136 mmol/L (ref 134–144)
Total Protein: 7.3 g/dL (ref 6.0–8.5)
eGFR: 102 mL/min/1.73

## 2024-06-18 LAB — TSH: TSH: 3.61 u[IU]/mL (ref 0.450–4.500)

## 2024-06-18 MED ORDER — AMPHETAMINE-DEXTROAMPHETAMINE 20 MG PO TABS: 20.0000 mg | ORAL_TABLET | Freq: Two times a day (BID) | ORAL | 0 refills | Status: AC

## 2024-06-18 NOTE — Assessment & Plan Note (Addendum)
 Controlled Labs drawn today Continue taking Zetia  10mg  as prescribed Will adjust treatment based on results Orders:   Lipid panel

## 2024-06-18 NOTE — Assessment & Plan Note (Addendum)
 Attention-deficit hyperactivity disorder, predominantly inattentive type Adderall effective for ADHD symptoms. - Refilled Adderall 20mg , continue taking as prescribed Orders:   amphetamine -dextroamphetamine  (ADDERALL) 20 MG tablet; Take 1 tablet (20 mg total) by mouth 2 (two) times daily.

## 2024-06-18 NOTE — Assessment & Plan Note (Addendum)
 Continue to monitor symptoms Denies any new or worsening symptoms Will adjust treatment based on labs Orders:   TSH

## 2024-06-18 NOTE — Progress Notes (Signed)
 "  Subjective:  Patient ID: Brittany Walton, female    DOB: March 12, 1961  Age: 64 y.o. MRN: [id number]  Chief Complaint  Patient presents with   Medical Management of Chronic Issues    HPI: Discussed the use of AI scribe software for clinical note transcription with the patient, who gave verbal consent to proceed.  History of Present Illness Brittany Walton is a 64 year old female who presents for a chronic follow-up visit.  She has experienced significant weight fluctuations, having lost 53 pounds over a few years and subsequently regaining approximately 40 pounds. Her initial weight loss was attributed to a program called Op[facility], which included food delivery and mental support. She discontinued the program after reaching her goal due to its cost. Her husband also participated and lost about 70 pounds. He is currently on Mounjaro, experiencing some stomach discomfort and reduced appetite.  She is currently taking Adderall, which initially suppresses her appetite but later results in normal hunger. She identifies as a 'sugar addict' and struggles with consuming unhealthy foods. She has tried various weight management programs, including Weight Watchers, but finds her weight tends to accumulate in the abdominal area, which she acknowledges as a risk for heart issues. She recalls trying phentermine  (Adipex) in the past without significant effects and is interested in exploring other weight management options, although her insurance does not cover certain medications despite her having high blood pressure.  Her blood pressure has been well-controlled on a reduced dose of 5 mg of her current medication, but it was noted to be high during this visit. She mentions a history of low blood pressure readings and is unsure why it is elevated today.  She reports experiencing unexplained bruising on her arms, which appears suddenly and is initially dark and bright. The bruising does not cause pain or itching. She  has a family history of autoimmune disease, as her mother had Lyme disease and an autoimmune condition.  She has a history of pneumonia and bronchitis and received a flu shot and the first shingles vaccine during her last visit.          06/18/2024   10:19 AM 02/22/2024   10:20 AM 11/14/2023    9:16 AM 06/29/2023   10:40 AM 03/15/2023   10:28 AM  Depression screen PHQ 2/9  Decreased Interest 0 0 0 0 0  Down, Depressed, Hopeless 0 0 0 0 0  PHQ - 2 Score 0 0 0 0 0  Altered sleeping 1 0 0  0  Tired, decreased energy 1 0 0  0  Change in appetite 0 0 0  0  Feeling bad or failure about yourself  0 0 0  0  Trouble concentrating  0 0  0  Moving slowly or fidgety/restless  0 0  0  Suicidal thoughts 0 0 0  0  PHQ-9 Score 2 0  0   0   Difficult doing work/chores  Not difficult at all Not difficult at all  Not difficult at all     Data saved with a previous flowsheet row definition        06/18/2024   10:25 AM  Fall Risk   Falls in the past year? 0  Number falls in past yr: 0  Injury with Fall? 0  Risk for fall due to : No Fall Risks  Follow up Falls evaluation completed    Patient Care Team: Sherre Clapper, MD as PCP - General (Family Medicine)   Review  of Systems  Constitutional:  Negative for appetite change, fatigue and fever.  HENT:  Negative for congestion, ear pain, sinus pressure and sore throat.   Respiratory:  Negative for cough, chest tightness, shortness of breath and wheezing.   Cardiovascular:  Negative for chest pain and palpitations.  Gastrointestinal:  Negative for abdominal pain, constipation, diarrhea, nausea and vomiting.  Genitourinary:  Negative for dysuria and hematuria.  Musculoskeletal:  Negative for arthralgias, back pain, joint swelling and myalgias.  Skin:  Negative for rash.       Spots on skin  Neurological:  Negative for dizziness, weakness and headaches.  Psychiatric/Behavioral:  Negative for dysphoric mood. The patient is not nervous/anxious.      Medications Ordered Prior to Encounter[1] Past Medical History:  Diagnosis Date   ADHD    Asthma    Biliary dyskinesia    Depression    GERD (gastroesophageal reflux disease)    Hyperlipidemia    Hypertension    Impaired fasting glucose    Lobar pneumonia 09/23/2022   Obesity    Prediabetes    Past Surgical History:  Procedure Laterality Date   CHOLECYSTECTOMY  2020   HERNIA REPAIR  2009   umbilical, bilateral inguinal   TONSILLECTOMY     VAGINAL HYSTERECTOMY  2006   for uterine prolapse    Family History  Problem Relation Age of Onset   Diabetes Mother    Heart attack Mother    Dementia Mother    Heart disease Father    Breast cancer Neg Hx    Social History   Socioeconomic History   Marital status: Married    Spouse name: Richard Holz   Number of children: 2   Years of education: Not on file   Highest education level: Bachelor's degree (e.g., BA, AB, BS)  Occupational History   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 She typically consumes wine.   Drug use: Never   Sexual activity: Yes    Partners: Male    Birth control/protection: None  Other Topics Concern   Not on file  Social History Narrative   Not on file   Social Drivers of Health   Tobacco Use: Low Risk (06/18/2024)   Patient History    Smoking Tobacco Use: Never    Smokeless Tobacco Use: Never    Passive Exposure: Not on file  Financial Resource Strain: Low Risk (01/16/2024)   Overall Financial Resource Strain (CARDIA)    Difficulty of Paying Living Expenses: Not very hard  Food Insecurity: No Food Insecurity (01/16/2024)   Epic    Worried About Programme Researcher, Broadcasting/film/video in the Last Year: Never true    Ran Out of Food in the Last Year: Never true  Transportation Needs: No  Transportation Needs (01/16/2024)   Epic    Lack of Transportation (Medical): No    Lack of Transportation (Non-Medical): No  Physical Activity: Insufficiently Active (06/17/2024)   Exercise Vital Sign    Days of Exercise per Week: 3 days    Minutes of Exercise per Session: 20 min  Stress: Stress Concern Present (06/17/2024)   [employer] of Occupational Health - Occupational Stress Questionnaire    Feeling of Stress: To some extent  Social Connections: Socially Integrated (01/16/2024)   Social Connection and  Isolation Panel    Frequency of Communication with Friends and Family: More than three times a week    Frequency of Social Gatherings with Friends and Family: Once a week    Attends Religious Services: 1 to 4 times per year    Active Member of Clubs or Organizations: Yes    Attends Banker Meetings: Patient declined    Marital Status: Married  Depression (PHQ2-9): Low Risk (06/18/2024)   Depression (PHQ2-9)    PHQ-2 Score: 2  Alcohol Screen: Low Risk (01/16/2024)   Alcohol Screen    Last Alcohol Screening Score (AUDIT): 1  Housing: Low Risk (01/16/2024)   Epic    Unable to Pay for Housing in the Last Year: No    Number of Times Moved in the Last Year: 0    Homeless in the Last Year: No  Utilities: Not At Risk (01/16/2024)   Epic    Threatened with loss of utilities: No  Health Literacy: Adequate Health Literacy (03/15/2023)   B1300 Health Literacy    Frequency of need for help with medical instructions: Never    Objective:  BP (!) 152/98 (BP Location: Left Arm, Patient Position: Sitting)   Pulse 77   Temp 97.8 F (36.6 C) (Temporal)   Ht 5' 6 (1.676 m)   Wt 185 lb (83.9 kg)   SpO2 97%   BMI 29.86 kg/m      06/18/2024   10:14 AM 04/23/2024    2:05 PM 04/23/2024    1:11 PM  BP/Weight  Systolic BP 152 133 143  Diastolic BP 98 86 87  Wt. (Lbs) 185  177  BMI 29.86 kg/m2  28.57 kg/m2    Physical Exam Vitals reviewed.  Constitutional:       Appearance: Normal appearance.  Neck:     Vascular: No carotid bruit.  Cardiovascular:     Rate and Rhythm: Normal rate and regular rhythm.     Heart sounds: Normal heart sounds.  Pulmonary:     Effort: Pulmonary effort is normal.     Breath sounds: Normal breath sounds.  Abdominal:     General: Bowel sounds are normal.     Palpations: Abdomen is soft.     Tenderness: There is no abdominal tenderness.  Skin:    Findings: Bruising present.  Neurological:     Mental Status: She is alert and oriented to person, place, and time.  Psychiatric:        Mood and Affect: Mood normal.        Behavior: Behavior normal.      Lab Results  Component Value Date   WBC 5.5 11/14/2023   HGB 14.3 11/14/2023   HCT 42.8 11/14/2023   PLT 339 11/14/2023   GLUCOSE 113 (H) 11/14/2023   CHOL 202 (H) 11/14/2023   TRIG 110 11/14/2023   HDL 66 11/14/2023   LDLCALC 117 (H) 11/14/2023   ALT 14 11/14/2023   AST 15 11/14/2023   NA 139 11/14/2023   K 4.5 11/14/2023   CL 102 11/14/2023   CREATININE 0.61 11/14/2023   BUN 16 11/14/2023   CO2 19 (L) 11/14/2023   TSH 2.270 11/14/2023   HGBA1C 5.8 02/22/2024    Results for orders placed or performed in visit on 03/07/24  POCT URINALYSIS DIP (CLINITEK)   Collection Time: 03/07/24  1:41 PM  Result Value Ref Range   Color, UA yellow yellow   Clarity, UA clear clear   Glucose, UA negative negative mg/dL  Bilirubin, UA negative negative   Ketones, POC UA negative negative mg/dL   Spec Grav, UA 8.979 8.989 - 1.025   Blood, UA negative negative   pH, UA 6.5 5.0 - 8.0   POC PROTEIN,UA negative negative, trace   Urobilinogen, UA 0.2 0.2 or 1.0 E.U./dL   Nitrite, UA Negative Negative   Leukocytes, UA Negative Negative  .  Assessment & Plan:   Assessment & Plan Moderate persistent asthma without complication Controlled Continue using Airsupra  as prescribed Denies any new or worsening symptoms Will adjust treatment based on symptoms    Mixed  hyperlipidemia Controlled Labs drawn today Continue taking Zetia  10mg  as prescribed Will adjust treatment based on results Orders:   Lipid panel  Primary hypertension Primary hypertension Blood pressure usually controlled on olmesartan  5 mg, elevated today. Recent dosage reduction due to low readings. - Rechecked blood pressure. - Continue olmesartan  5 mg. 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 - Refilled Adderall 20mg , continue taking as prescribed Orders:   amphetamine -dextroamphetamine  (ADDERALL) 20 MG tablet; Take 1 tablet (20 mg total) by mouth 2 (two) times daily.  Mild recurrent major depression Controlled Denies any new or worsening symptoms Continue taking Zoloft  50mg  as prescribed Will adjust treatment based on symptoms    Impaired fasting glucose Impaired fasting glucose Potential risk for type 2 diabetes. Blood work planned to assess glucose levels. - Ordered blood work to assess glucose levels. Orders:   Hemoglobin A1c  Gastroesophageal reflux disease without esophagitis Controlled Continue to monitor symptoms  Continue taking Prilosec 40mg  as prescribed    Myalgia Continue to monitor symptoms Denies any new or worsening symptoms Will adjust treatment based on labs Orders:   TSH  Need for vaccination  Orders:   Varicella-zoster vaccine IM  Obesity due to excess calories with serious comorbidity, unspecified class Obesity Comorbidity include: Hypertension, Mixed Hyperlipidemia.  Recent weight gain after initial loss. Previous success with Opdivo program. Insurance does not cover GLP-1 agonists. Current use of Adderall ineffective for weight loss. Discussed Contrave  as alternative, potential side effects reviewed. -  Prescribed Contrave  and checked for samples. - Consider cash pay option with savings card if insurance denies. - Monitor for side effects such as heart racing, difficulty sleeping, and nausea. Orders:   Naltrexone-buPROPion HCl ER (CONTRAVE ) 8-90 MG TB12; Take 1 tablet by mouth daily for 7 days, THEN 1 tablet in the morning and at bedtime for 7 days, THEN 3 tablets daily as needed for 7 days (Two tablets in the morning and one tablet in the evening), THEN 2 tablets in the morning and at bedtime for 7 days.  General Health Maintenance Due for second shingles vaccine and tetanus booster. Discussed tetanus booster benefits, especially with lung history. - Administered second shingles vaccine. - Plan for tetanus booster at next visit.  Body mass index is 29.86 kg/m.    No orders of the defined types were placed in this encounter.   No orders of the defined types were placed in this encounter.      Follow-up: No follow-ups on file.  An After Visit Summary was printed and given to the patient.    I,Lauren M Auman,acting as a neurosurgeon for [employer], [employer].,have documented all relevant documentation on the behalf of Nola Angles, PA,as directed by  Nola Angles, PA while in the presence of Nola Angles,  PA.    Nola Angles, PA [facility] [phone]      [1]  Current Outpatient Medications on File Prior to Visit  Medication Sig Dispense Refill   Albuterol -Budesonide (AIRSUPRA ) 90-80 MCG/ACT AERO Inhale 2 puffs into the lungs 4 (four) times daily as needed. 10.7 g 2   amphetamine -dextroamphetamine  (ADDERALL) 20 MG tablet Take 1 tablet (20 mg total) by mouth 2 (two) times daily. 60 tablet 0   estradiol  (ESTRACE ) 0.01 % CREA vaginal cream Place 0.5 g vaginally 2 (two) times a week. Place 0.5g nightly for two weeks then twice a week after 42 g 11   ezetimibe  (ZETIA ) 10 MG tablet TAKE 1 TABLET BY MOUTH EVERY DAY 90 tablet 1   fluticasone  (FLONASE ) 50 MCG/ACT nasal spray Place 2  sprays into both nostrils daily. 16 g 6   Fluticasone -Umeclidin-Vilant (TRELEGY ELLIPTA ) 100-62.5-25 MCG/ACT AEPB Inhale 1 puff by mouth once daily, 180 each 3   ibuprofen  (ADVIL ) 800 MG tablet Take 1 tablet (800 mg total) by mouth every 8 (eight) hours as needed. 90 tablet 1   montelukast  (SINGULAIR ) 10 MG tablet TAKE 1 TABLET BY MOUTH EVERYDAY AT BEDTIME 90 tablet 3   olmesartan  (BENICAR ) 5 MG tablet TAKE 1 TABLET (5 MG TOTAL) BY MOUTH DAILY. 90 tablet 0   omeprazole  (PRILOSEC) 40 MG capsule TAKE 1 CAPSULE BY MOUTH TWICE A DAY 180 capsule 3   sertraline  (ZOLOFT ) 50 MG tablet TAKE 1 TABLET BY MOUTH EVERY DAY 90 tablet 1   No current facility-administered medications on file prior to visit.   "

## 2024-06-18 NOTE — Assessment & Plan Note (Addendum)
 Controlled Denies any new or worsening symptoms Continue taking Zoloft  50mg  as prescribed Will adjust treatment based on symptoms

## 2024-06-18 NOTE — Assessment & Plan Note (Addendum)
 Primary hypertension Blood pressure usually controlled on olmesartan  5 mg, elevated today. Recent dosage reduction due to low readings. - Rechecked blood pressure. - Continue olmesartan  5 mg. Orders:   Comprehensive metabolic panel with GFR   CBC with Differential/Platelet

## 2024-06-18 NOTE — Assessment & Plan Note (Addendum)
 Controlled Continue to monitor symptoms  Continue taking Prilosec 40mg  as prescribed

## 2024-06-19 MED ORDER — CONTRAVE 8-90 MG PO TB12: ORAL_TABLET | ORAL | 0 refills | Status: AC

## 2024-06-19 NOTE — Assessment & Plan Note (Addendum)
 Obesity Comorbidity include: Hypertension, Mixed Hyperlipidemia.  Recent weight gain after initial loss. Previous success with Opdivo program. Insurance does not cover GLP-1 agonists. Current use of Adderall ineffective for weight loss. Discussed Contrave  as alternative, potential side effects reviewed. - Prescribed Contrave  and checked for samples. - Consider cash pay option with savings card if insurance denies. - Monitor for side effects such as heart racing, difficulty sleeping, and nausea. Orders:   Naltrexone-buPROPion HCl ER (CONTRAVE ) 8-90 MG TB12; Take 1 tablet by mouth daily for 7 days, THEN 1 tablet in the morning and at bedtime for 7 days, THEN 3 tablets daily as needed for 7 days (Two tablets in the morning and one tablet in the evening), THEN 2 tablets in the morning and at bedtime for 7 days.

## 2024-06-20 ENCOUNTER — Ambulatory Visit: Payer: Self-pay | Admitting: Physician Assistant

## 2024-06-24 ENCOUNTER — Other Ambulatory Visit (HOSPITAL_COMMUNITY): Payer: Self-pay

## 2024-06-24 ENCOUNTER — Other Ambulatory Visit: Payer: Self-pay

## 2024-06-24 MED ORDER — OLMESARTAN MEDOXOMIL 20 MG PO TABS: ORAL_TABLET | ORAL | 0 refills | Status: AC

## 2024-06-24 NOTE — Telephone Encounter (Signed)
 Patient called stating that regardless of taking the Olmesartan  5 mg her Blood pressure has still steady been staying in the 160's so patient had been taking her prescription for Olmesartan  20mg  and her blood pressure has stayed within normal limits and was requesting provider to send in rx for the 20 mg. Spoke with Nola Angles and per Nola it is ok to send refill for olmesartan  20mg  once daily and discontinue Olmesartan  5 mg. Patient understood verbally and refill was sent to pharmacy.

## 2024-06-26 ENCOUNTER — Other Ambulatory Visit: Payer: Self-pay

## 2024-09-24 ENCOUNTER — Ambulatory Visit: Admitting: Family Medicine
